# Patient Record
Sex: Male | Born: 1950 | Hispanic: Refuse to answer | Smoking: Never smoker
Health system: Southern US, Community
[De-identification: ages and names within clinical notes are randomized; demographics above are authoritative.]

## PROBLEM LIST (undated history)

## (undated) DIAGNOSIS — I5030 Unspecified diastolic (congestive) heart failure: Secondary | ICD-10-CM

## (undated) DIAGNOSIS — I10 Essential (primary) hypertension: Secondary | ICD-10-CM

## (undated) DIAGNOSIS — C61 Malignant neoplasm of prostate: Secondary | ICD-10-CM

## (undated) DIAGNOSIS — I499 Cardiac arrhythmia, unspecified: Secondary | ICD-10-CM

## (undated) HISTORY — DX: Essential (primary) hypertension: I10

## (undated) HISTORY — DX: Malignant neoplasm of prostate: C61

## (undated) HISTORY — DX: Unspecified diastolic (congestive) heart failure: I50.30

## (undated) HISTORY — DX: Cardiac arrhythmia, unspecified: I49.9

## (undated) HISTORY — PX: OTHER SURGICAL HISTORY: SHX169

---

## 2002-06-09 ENCOUNTER — Emergency Department (HOSPITAL_COMMUNITY): Admission: EM | Admit: 2002-06-09 | Discharge: 2002-06-10 | Payer: Self-pay | Admitting: Emergency Medicine

## 2002-06-10 ENCOUNTER — Inpatient Hospital Stay (HOSPITAL_COMMUNITY): Admission: EM | Admit: 2002-06-10 | Discharge: 2002-06-13 | Payer: Self-pay | Admitting: Emergency Medicine

## 2002-06-11 ENCOUNTER — Encounter: Payer: Self-pay | Admitting: *Deleted

## 2002-06-11 ENCOUNTER — Encounter (INDEPENDENT_AMBULATORY_CARE_PROVIDER_SITE_OTHER): Payer: Self-pay | Admitting: Cardiology

## 2004-07-21 ENCOUNTER — Emergency Department (HOSPITAL_COMMUNITY): Admission: EM | Admit: 2004-07-21 | Discharge: 2004-07-21 | Payer: Self-pay | Admitting: Emergency Medicine

## 2005-07-02 ENCOUNTER — Ambulatory Visit (HOSPITAL_BASED_OUTPATIENT_CLINIC_OR_DEPARTMENT_OTHER): Admission: RE | Admit: 2005-07-02 | Discharge: 2005-07-02 | Payer: Self-pay | Admitting: Urology

## 2007-06-03 ENCOUNTER — Emergency Department (HOSPITAL_COMMUNITY): Admission: EM | Admit: 2007-06-03 | Discharge: 2007-06-03 | Payer: Self-pay | Admitting: Emergency Medicine

## 2007-12-02 ENCOUNTER — Ambulatory Visit: Admission: RE | Admit: 2007-12-02 | Discharge: 2007-12-09 | Payer: Self-pay | Admitting: Radiation Oncology

## 2008-01-30 ENCOUNTER — Ambulatory Visit: Admission: RE | Admit: 2008-01-30 | Discharge: 2008-02-23 | Payer: Self-pay | Admitting: Radiation Oncology

## 2008-03-09 ENCOUNTER — Emergency Department (HOSPITAL_COMMUNITY): Admission: EM | Admit: 2008-03-09 | Discharge: 2008-03-09 | Payer: Self-pay | Admitting: Emergency Medicine

## 2008-05-21 ENCOUNTER — Encounter: Admission: RE | Admit: 2008-05-21 | Discharge: 2008-05-21 | Payer: Self-pay | Admitting: Urology

## 2008-06-14 ENCOUNTER — Ambulatory Visit (HOSPITAL_BASED_OUTPATIENT_CLINIC_OR_DEPARTMENT_OTHER): Admission: RE | Admit: 2008-06-14 | Discharge: 2008-06-14 | Payer: Self-pay | Admitting: Urology

## 2008-07-01 ENCOUNTER — Ambulatory Visit: Admission: RE | Admit: 2008-07-01 | Discharge: 2008-08-17 | Payer: Self-pay | Admitting: Radiation Oncology

## 2008-10-07 ENCOUNTER — Emergency Department (HOSPITAL_COMMUNITY): Admission: EM | Admit: 2008-10-07 | Discharge: 2008-10-07 | Payer: Self-pay | Admitting: Emergency Medicine

## 2009-11-06 ENCOUNTER — Emergency Department (HOSPITAL_COMMUNITY): Admission: EM | Admit: 2009-11-06 | Discharge: 2009-11-06 | Payer: Self-pay | Admitting: Emergency Medicine

## 2010-01-25 IMAGING — CR DG CHEST 2V
3 series · 3 of 3 positions shown · non-contrast
Comparison: None

CLINICAL DATA: Prostate carcinoma, preop

CHEST - 2 VIEW

[view not recorded (1 of 3)]
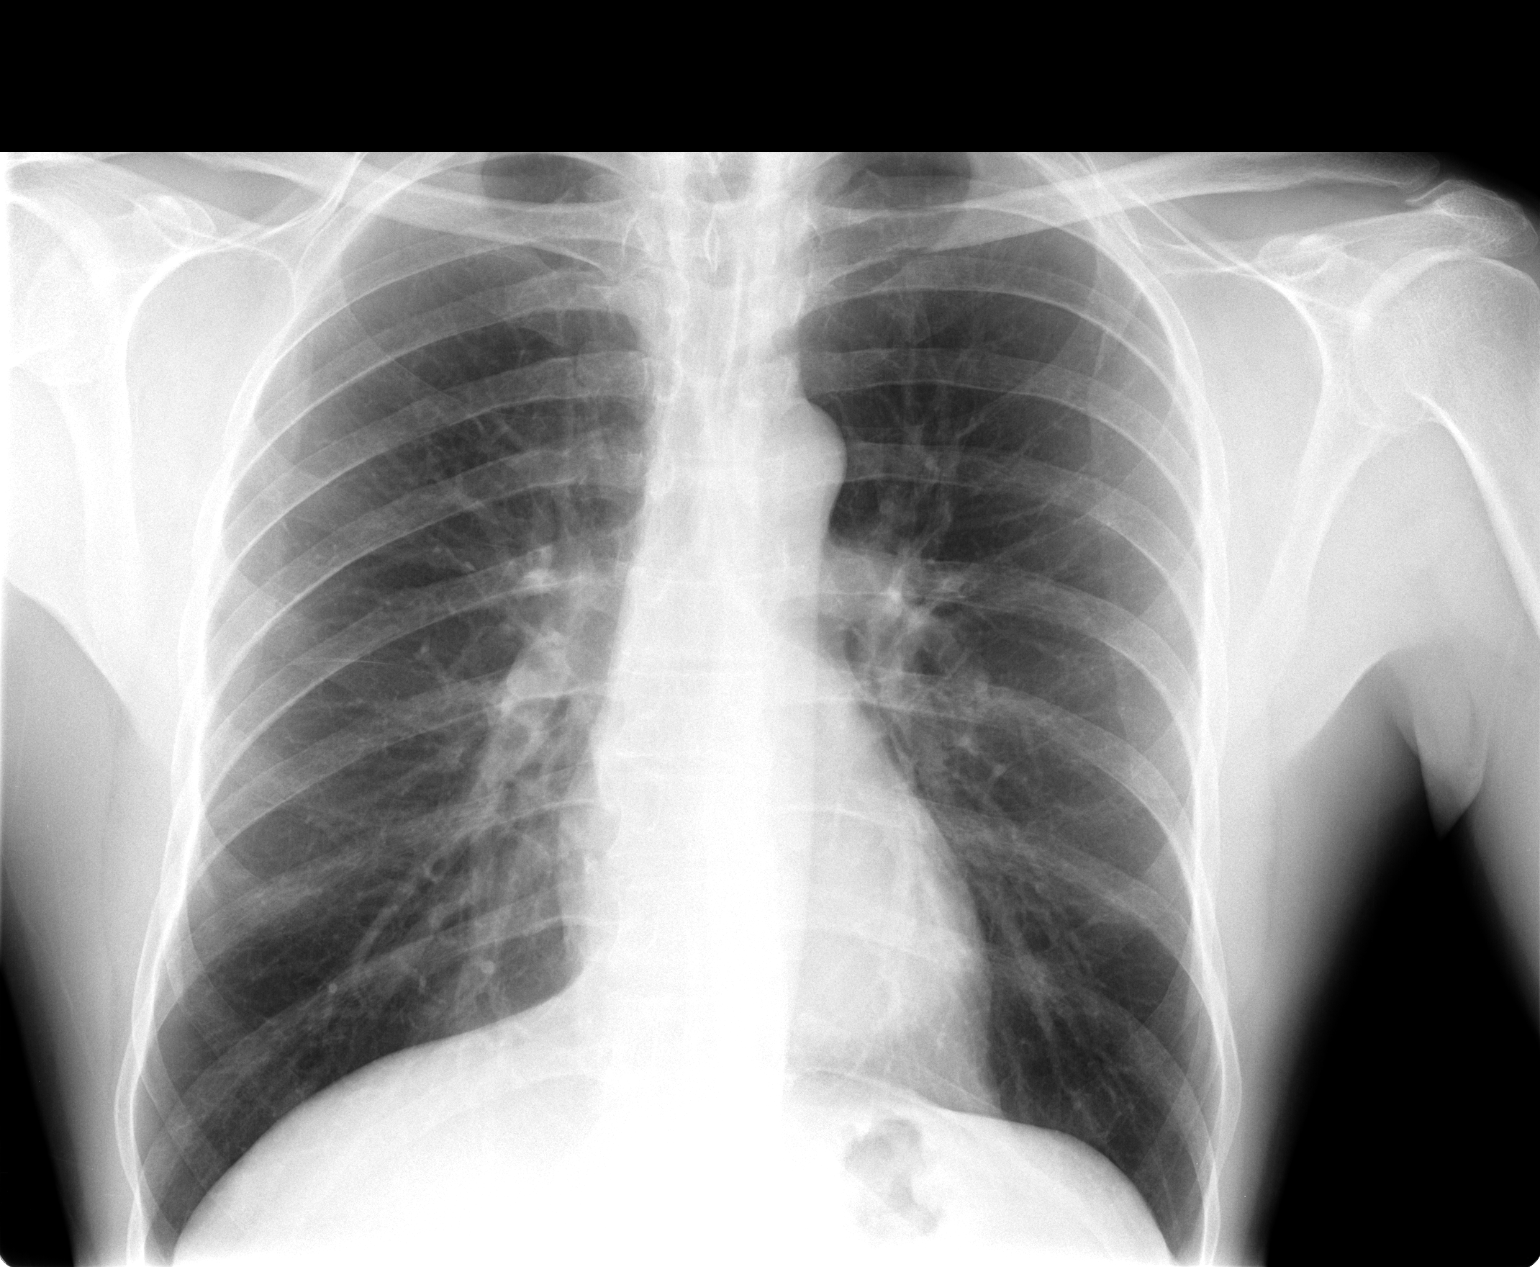

[view not recorded (2 of 3)]
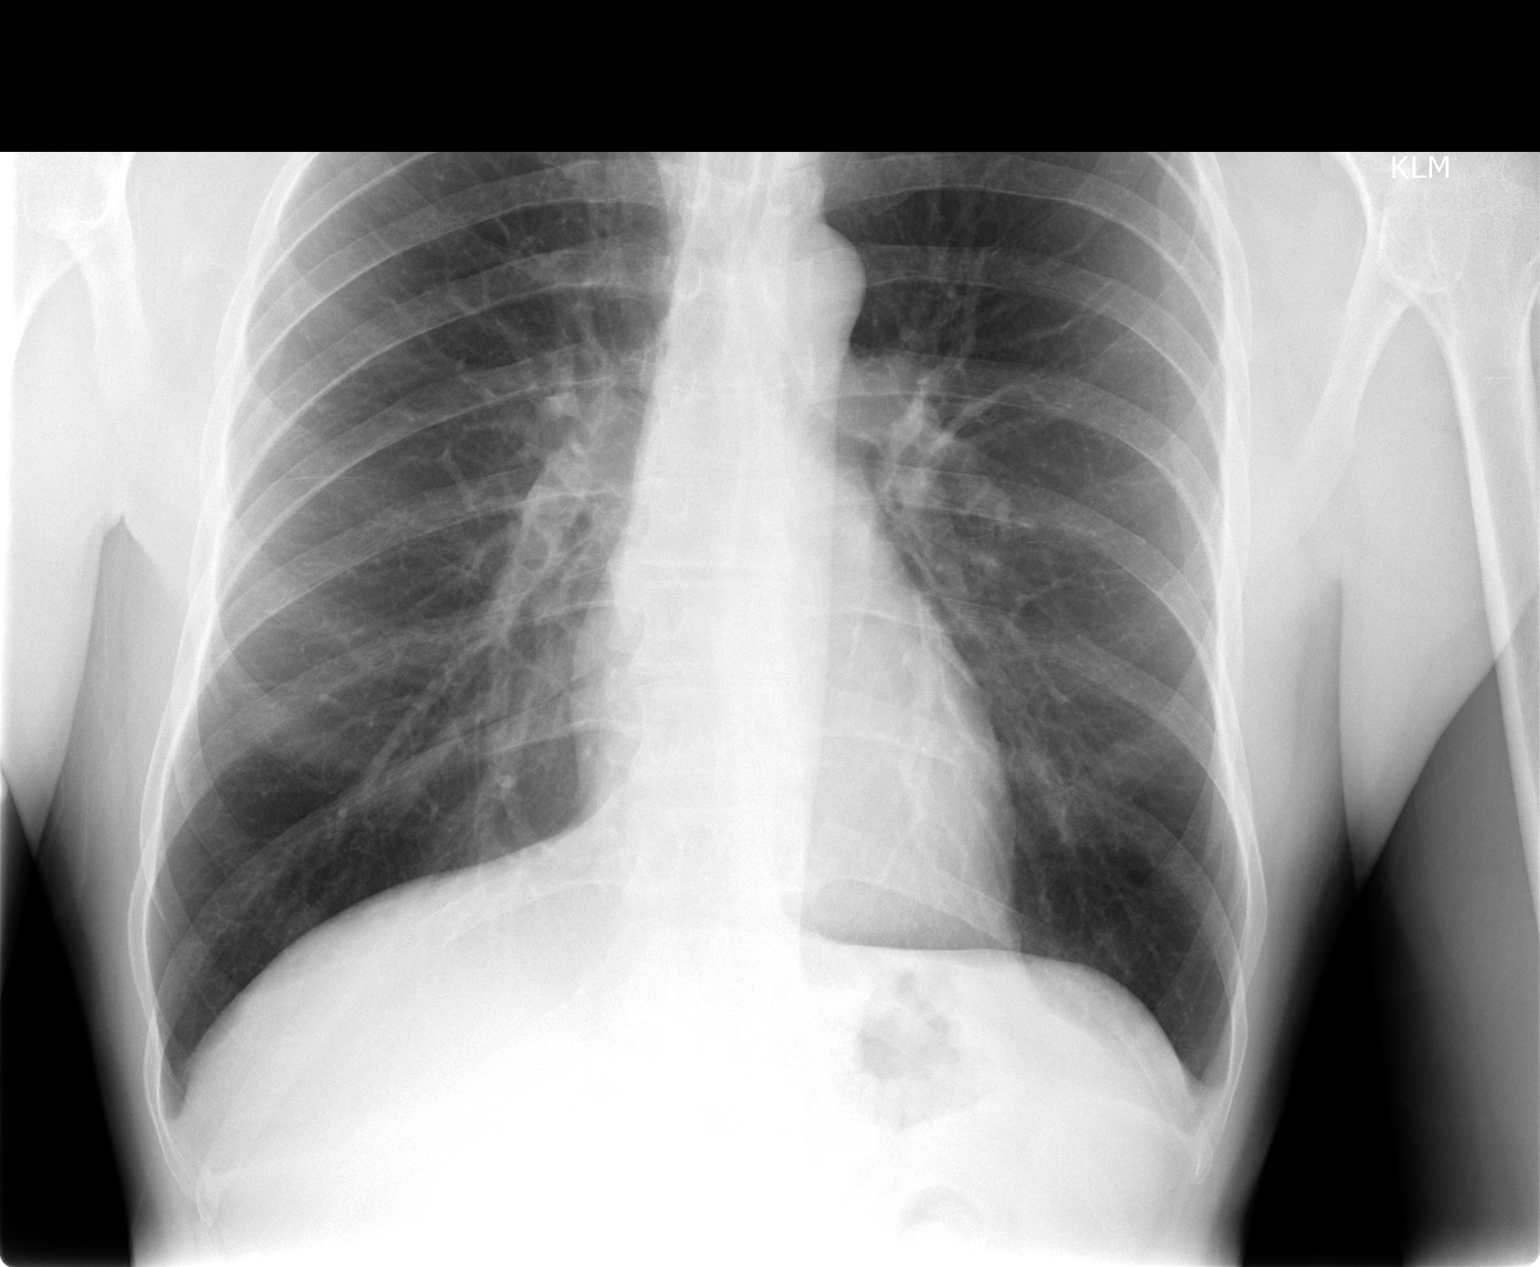

[view not recorded (3 of 3)]
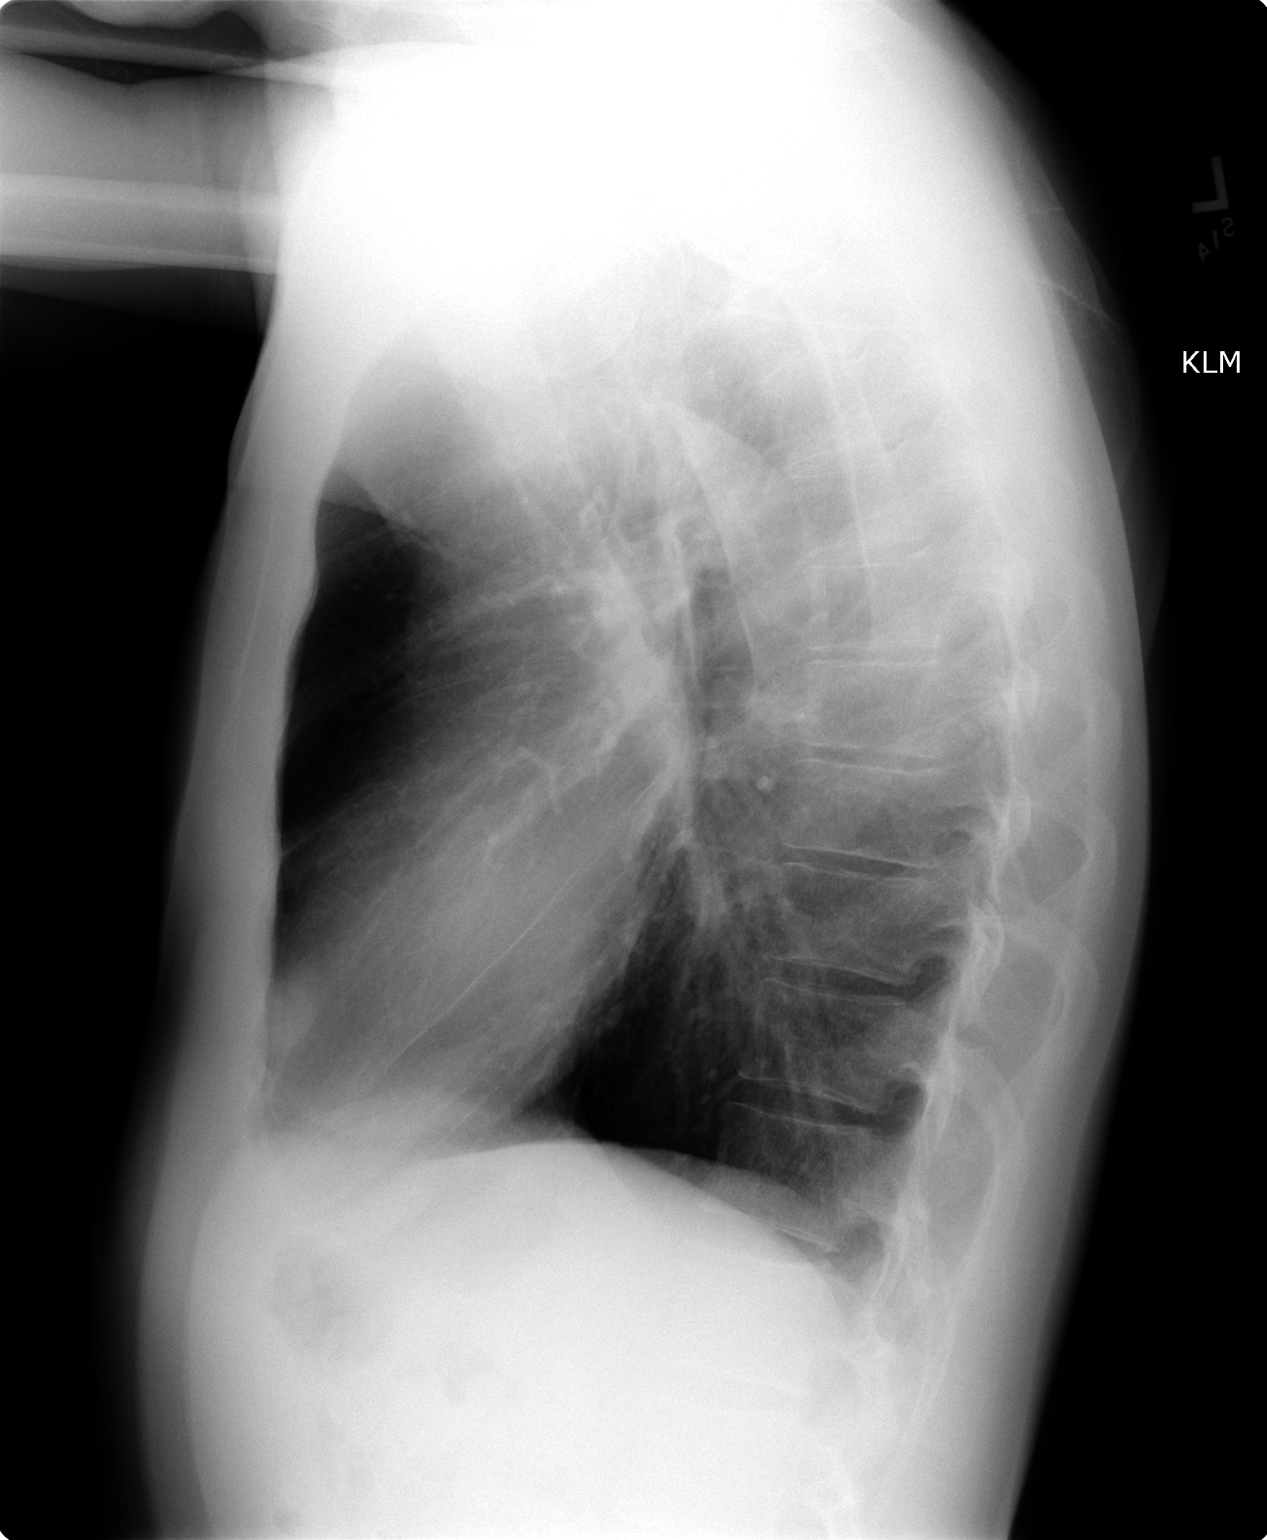

[3 of 3 positions shown; findings below may reference images not displayed]

FINDINGS: The lungs are clear. The heart is within normal limits in
size. No bony abnormality is seen.
IMPRESSION: No active lung disease.

## 2010-07-06 LAB — COMPREHENSIVE METABOLIC PANEL
ALT: 14 U/L (ref 0–53)
AST: 15 U/L (ref 0–37)
Albumin: 3.9 g/dL (ref 3.5–5.2)
Alkaline Phosphatase: 49 U/L (ref 39–117)
BUN: 12 mg/dL (ref 6–23)
CO2: 31 mEq/L (ref 19–32)
Calcium: 9 mg/dL (ref 8.4–10.5)
Chloride: 103 mEq/L (ref 96–112)
Creatinine, Ser: 1.34 mg/dL (ref 0.4–1.5)
GFR calc Af Amer: >60 mL/min (ref >60)
GFR calc non Af Amer: 55 mL/min — ABNORMAL LOW (ref >60)
Glucose, Bld: 108 mg/dL — ABNORMAL HIGH (ref 70–99)
Potassium: 4 mEq/L (ref 3.5–5.1)
Sodium: 138 mEq/L (ref 135–145)
Total Bilirubin: 0.7 mg/dL (ref 0.3–1.2)
Total Protein: 6 g/dL (ref 6.0–8.3)

## 2010-07-06 LAB — CBC
HCT: 38 % — ABNORMAL LOW (ref 39.0–52.0)
Hemoglobin: 13.1 g/dL (ref 13.0–17.0)
MCHC: 34.6 g/dL (ref 30.0–36.0)
MCV: 88.6 fL (ref 78.0–100.0)
Platelets: 176 10*3/uL (ref 150–400)
RBC: 4.29 MIL/uL (ref 4.22–5.81)
RDW: 12.6 % (ref 11.5–15.5)
WBC: 4.8 10*3/uL (ref 4.0–10.5)

## 2010-08-08 NOTE — Op Note (Signed)
NAME:  Gregory Gomez, Gregory Gomez NO.:  [id number]   MEDICAL RECORD NO.:  [id number]          PATIENT TYPE:  AMB   LOCATION:  [facility]                         FACILITY:  [facility]   PHYSICIAN:  Valetta Fuller, M.D.  DATE OF BIRTH:  [birth year]   DATE OF PROCEDURE:  DATE OF DISCHARGE:                               OPERATIVE REPORT   PREOPERATIVE DIAGNOSIS:  Clinical stage T1C adenocarcinoma of the  prostate.   POSTOPERATIVE DIAGNOSIS:  Clinical stage T1C adenocarcinoma of the  prostate.   PROCEDURE PERFORMED:  1. I-125 prostatic seed implantation via Contractor.  2. Flexible cystoscopy.   SURGEON:  Dr. Isabel Caprice   ASSISTANT:  Dr. Dayton Scrape.   ANESTHESIA:  General.   INDICATIONS:  Gregory Gomez was diagnosed with adenocarcinoma of the  prostate in August 2009.  The patient had a small prostate with 3 out of  12 cores positive.  The majority of his tumor was Gleason 6, but there  was one core positive for Gleason 7 tumor.  The patient has had fairly  normal sexual functioning with minimal voiding symptoms.  The patient  was felt to have favorable to intermediate risk clinical stage T1C  adenocarcinoma of the prostate.  The patient underwent extensive  consultation with myself, as well as Dr. Dayton Scrape about treatment options.  We had suggested strong consideration for robotic prostatectomy given  his young age and good health, but felt that given his relatively  favorable disease that he would likely have good outcome with seed  implantation.  The patient initially was on the schedule for the fall  for seed implantation.  He elected to postpone his surgery and cancel  his surgery in December 2009.  The patient subsequently called to  reschedule.  He appears to understand the advantages and disadvantages  of seed implantation.  Full informed consent was obtained.   OPERATIVE TECHNIQUE AND FINDINGS:  The patient was brought to the  operating room.  He had successful  induction of general anesthesia and  was placed in lithotomy position.  A Foley catheter was placed sterilely  on the field.  The patient had a transrectal ultrasound probe placed  with anchoring needles in the prostate and the ultrasound was anchored  to a stand.  Dr. Dayton Scrape and the radiation oncology team then did real-  time contouring of the prostate, urethra and rectum.  Real-time dose was  established.  We then came in for the actual needle placement.  A total  of 27 needles were placed.  Each needle was placed with real-time  sagittal ultrasound guidance.  The Nucletron drawn robotic implanter was  then used for implantation of the seeds.  A total number of 82 active  seeds  were implanted.  Reference dose was 145 gray.  At the completion of the  procedure fluoroscopic imaging suggested good distribution of the seeds.  Flexible cystoscopy revealed no evidence of seeds within the prostatic  urethra or bladder and a new Foley catheter was placed.  The patient was  brought to the recovery room in stable condition.      Gregory Hua  Ellin Gomez, M.D.  Electronically Signed     DSG/MEDQ  D:  06/14/2008  T:  06/14/2008  Job:  [id number]

## 2010-08-11 NOTE — H&P (Signed)
NAME:  Gregory Gomez, Gregory Gomez                        ACCOUNT NO.:  [id number]   MEDICAL RECORD NO.:  [id number]                   PATIENT TYPE:  INP   LOCATION:  3730                                 FACILITY:  [facility]   PHYSICIAN:  Thora Lance, M.D.               DATE OF BIRTH:  1950-08-30   DATE OF ADMISSION:  06/10/2002  DATE OF DISCHARGE:                                HISTORY & PHYSICAL   CHIEF COMPLAINT:  Palpitations.   HISTORY OF PRESENT ILLNESS:  The patient is a 60 year old, black, male  patient of Thora Lance, M.D., who for the past 24 hours has had episodes  of palpitations on and off.  When they occur, he gets dyspneic and if he is  walking when they occur, he has to stop and get his breath.  He presented to  the walk-in clinic yesterday and was found to have a heart rate of 170.  He  was then sent to [facility].  After adenosine IV in the  hospital, his heart rate decreased and he went back into sinus rhythm with a  rate of 80.  He appeared to be in an SVT according to the ER admission EKG.  While at home last evening, he continued to have episodes of palpitations  with dyspnea.  He has never had this before.  He  has had no chest pain or  pressure.   In the office, he was found to be in SVT with a heart rate of 170.  He is  being admitted for the same.   REVIEW OF SYSTEMS:  No fever.  No recent illness.  CHEST:  No chest pressure  or pain.  LUNGS:  Dyspnea as above.  No wheezing.   PAST MEDICAL HISTORY:  Constipation.   PAST SURGICAL HISTORY:  Tonsillectomy.   SOCIAL HISTORY:  No ETOH use.  No caffeine.  Denies any illicit drug use.   FAMILY HISTORY:  His father died at age 62 from unknown cause.  A brother  died from an MI at age 51.  Another brother had a heart attack in his 57s.   OBJECTIVE DATA:  VITAL SIGNS:  Temperature 96.8 degrees, pulse 74 initially,  respirations 22.  WEIGHT:  164 pounds.  GENERAL APPEARANCE:  He is alert and  oriented.  He appeared very pleasant.  HEENT:  Eyes:  PERRLA.  EOMs intact.  Ears, nose, and throat unremarkable.  NECK:  No JVD.  The thyroid gland is not enlarged.  No masses or  irregularities palpated.  HEART:  Rapid S1 and S2.  I do not hear a murmur, but he is tachycardic at  160.  LUNGS:  Breath sounds are clear.  ABDOMEN:  Nontender.  EXTREMITIES:  No edema.  NEUROLOGIC:  Nonfocal.   LABORATORY DATA:  The EKG shows PSVT with a heart rate of 170.   IMPRESSION:  Paroxysmal supraventricular  tachycardia.    PLAN:  1. Admit.  2. IV Cardizem.  3. Cardiology follow-up.     Tamera C. Lianne Cure                     Thora Lance, M.D.    TCL/MEDQ  D:  06/10/2002  T:  06/10/2002  Job:  [id number]

## 2010-08-11 NOTE — Consult Note (Signed)
NAME:  [name], Gomez                        ACCOUNT NO.:  [id number]   MEDICAL RECORD NO.:  [id number]                   PATIENT TYPE:  INP   LOCATION:  3730                                 FACILITY:  [facility]   PHYSICIAN:  Lesleigh Noe, M.D.            DATE OF BIRTH:  02/28/1951   DATE OF CONSULTATION:  06/10/2002  DATE OF DISCHARGE:                                   CONSULTATION   REQUESTING PHYSICIAN:  Thora Lance, M.D.   REASON FOR EVALUATION:  Tachyarrhythmia.   CONCLUSIONS:  1. 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  2. Marked ST segment depression during arrhythmia, probably rate related.     Rule out coronary artery disease.   RECOMMENDATIONS:  1. IV Cardizem bolus followed by infusion to break arrhythmia.  2. IV Heparin.  3. A 2-D Doppler echocardiogram.  4. Stress Cardiolite to rule out CAD.  5. Will probably start oral beta blocker or oral Cardizem for arrhythmia     control and follow clinically.  If the arrhythmia is not     managed/controlled by oral medications may need to consider doing EP     ablative therapy.   COMMENTS/HISTORY OF PRESENT ILLNESS:  The patient is a 60 year old,  previously healthy, African-American male with no prior cardiac history.  Over the past 24 hours he has had 2 prolonged episodes of tachypalpitations  that cause weakness and sudden dyspnea.  He was seen in the [facility] Emergency Room last evening after presenting to the [facility].  He was given IV Adenocard, the arrhythmia broke and he was  discharged home.  This morning, the arrhythmia recurred.   He came in to Dr. Jone Baseman office and was noted, again, to be in SVT at a  rate of approximately 170.  He was sent to the emergency room and we gave  instructions to give IV bolus of Cardizem followed by a continuous drip and  this reverted the arrhythmia.  He currently feels well and is having  no  discomfort, shortness of breath, or other complaint.   MEDICATIONS:  Metamucil.   ALLERGIES:  None.   PAST MEDICAL HISTORY:  Significant past medical problems:  None.   SOCIAL HISTORY:  Habits:  Does not smoke or drink.   FAMILY HISTORY:  Father died in his late 85s, etiology uncertain.  Mother is  alive and in good health.  Two brothers have had myocardial infarction.  One  brother died at age 40. Another brother had a heart attack at age 71, but is  still living.   REVIEW OF SYSTEMS:  Intermittent sinus congestion.  Occasional feeling of  abdominal bloating.  This is relieved by bowel movements.  Occasional lumps  on neck that resolve.  No prior history of palpitations.  Never had syncope.  No chest discomfort.  No nocturia or other complaints.  PHYSICAL EXAMINATION:  GENERAL:  On exam the patient is in no acute  distress.  VITAL SIGNS:  He is a well-developed, well-nourished, African-American male  with a blood pressure of 118/70, heart rate 88, respirations 16 and  unlabored.  SKIN:  Warm and dry. No cyanosis was noted.  HEENT:  Reveals pupils that are equally reactive to light.  Extraocular  movements are full.  NECK:  Neck exam reveals no JVD, carotid bruits, or thyromegaly.  CHEST:  Chest was clear to auscultation and percussion.  CARDIOVASCULAR:  Cardiac exam reveals no click, no rub, no gallop, no  murmur.  ABDOMEN:  Soft.  Liver edge was not palpable.  Bowel sounds are normal.  EXTREMITIES:  Reveal no edema.  Pulses are 2+ and symmetric in upper and  lower extremities.  NEUROLOGIC:  Exam is normal.   LABORATORY DATA:  Hemoglobin is 15.6, WBC 7.1.  Potassium 3.7, BUN 11,  creatinine 1.2.  CK/MB and troponin I are negative x2.  EKG reveals  nonspecific ST-T abnormality.  EKG seems to demonstrate sinus rhythm with  first degree AV block postconversion.   ASSESSMENT:  1. Paroxysmal supraventricular tachycardia, recurrent.  Suspect this     represents AV nodal  reentry tachycardia.  Rule out other.  2. Marked ST abnormality during arrhythmia and nonspecific ST abnormality     post conversion.  Rule out coronary disease.  I suspect that these ST-T     changes are rate related and not actual ischemic changes due to     obstructive coronary disease.                                               Lesleigh Noe, M.D.    HWS/MEDQ  D:  06/10/2002  T:  06/11/2002  Job:  [id number]   cc:   Thora Lance, M.D.  [address]  [place]  [place] [address]  Fax: [phone]

## 2010-08-11 NOTE — Consult Note (Signed)
NAME:  Gregory Gomez, Silversmith                        ACCOUNT NO.:  [id number]   MEDICAL RECORD NO.:  [id number]                   PATIENT TYPE:  INP   LOCATION:  3730                                 FACILITY:  [facility]   PHYSICIAN:  Doylene Canning. Ladona Ridgel, M.D. [facility]           DATE OF BIRTH:  Jun 21, 1950   DATE OF CONSULTATION:  06/12/2002  DATE OF DISCHARGE:  06/13/2002                                   CONSULTATION   REASON FOR CONSULTATION:  Evaluation of supraventricular tachycardia.   HISTORY OF PRESENT ILLNESS:  The patient is a very pleasant 60 year old man  who has been fairly healthy.  He has a past medical history of constipation  but no hypertension or other significant symptoms.  Over the last several  weeks, he has had increasing palpitations and subsequently had documented  SVT at rates of up to 170 beats per minute. This has been treated with IV  adenosine which apparently did not terminate his tachycardia.  Calcium  channel blockers have slowed his heart rate.   Because of marked EKG abnormality, he underwent exercise Cardiolite stress  testing demonstrating by report no ischemia.  He has been subsequently  hospitalized and treated with beta blockers (Betapace), and his heart rate  has improved.   Careful review of his rhythm strips demonstrates a narrow QRS tachycardia at  up to 170 beats per minute.  At other times, he has very low-amplitude  atrial arrhythmias with what appears to be an underlying atrial  tachycardia/flutter at a cycle length of around 330 to 340 msec associated  with variable A-V block, typically 2:1 A-V block.   The patient denies any symptoms consistent with thyroid problems.  He denies  any other significant complaints today.  Otherwise, his health has been  quite good.   FAMILY HISTORY:  Notable for a mother who has a history of arrhythmias.  He  has a family history of coronary disease as well.   SOCIAL HISTORY:  The patient is married and lives in  [place].  He  denies tobacco or ethanol use.   REVIEW OF SYSTEMS:  Please refer to [name]'s Review of Systems.  In  general, he has palpitations and weakness associated with his tachycardia  but otherwise Review of Systems is negative.   PHYSICAL EXAMINATION:  GENERAL:  Pleasant, well-appearing, middle-aged man  in no distress.  VITAL SIGNS:  Blood pressure 108/70, pulse 90 and regular, respirations 18,  temperature 99.  HEENT:  Normocephalic and atraumatic.  Pupils equal and round.  Oropharynx  moist.  Sclerae anicteric.  NECK:  No jugular venous distention.  There was no thyromegaly.  Carotids  were 2+ and symmetric.  CARDIOVASCULAR:  Regular rate and rhythm with normal S1 and S2.  I did not  appreciate any murmurs, rubs, or gallops.  LUNGS:  Clear bilaterally to auscultation.  ABDOMEN:  Soft, nontender, nondistended.  There was no obvious organomegaly.  EXTREMITIES:  Demonstrated no cyanosis, clubbing, or edema.   LABORATORY DATA:  EKG demonstrates very low-amplitude atrial signals but  what appears to be 2:1 atrial tachycardia with 1 P wave and in a T wave and  one P wave buried in the QRS.   Laboratory data of importance incudes a TSH of 2.342.   IMPRESSION:  1. Probable atypical atrial flutter with variable arteriovenous conduction     versus atrial tachycardia (incessant) with variable arteriovenous     conduction.  2. Palpitations secondary to #1.  3. Abnormal electrocardiogram with negative Cardiolite.   RECOMMENDATIONS:  At the present time, Betapace is being used for this  patient.  I think a plain beta blocker like Toprol or Tenormin would also be  an option and might not carry the risk of proarrhythmia in this patient.  For now, I think rate control would be reasonable.  I agree with Coumadin as  patient's particularly who have left atrial tachycardia certainly have the  same increased risk of thromboembolism as do patients who have atrial  fibrillation  or atrial flutter.   I will plan to see the patient as an outpatient to talk about additional  treatments including ablation therapy for this patient, although I think the  finding of what appears to be an atypical atrial flutter makes the mechanism  of his flutter more likely to be in the left atrium rather than the right  atrium.                                               Doylene Canning. Ladona Ridgel, M.D. [facility]    GWT/MEDQ  D:  06/12/2002  T:  06/14/2002  Job:  [id number]   cc:   Thora Lance, M.D.  [address]  [place]  [place] [address]  Fax: [phone]   Armstead Peaks, M.D.  [address] [place], [place] [address]  Fax: [phone]   Lyn Records III, M.D.  [address]  [place]  [place] [address]  Fax: [phone]   Kathrine Cords, R.N. [facility]

## 2010-08-11 NOTE — Op Note (Signed)
NAME:  Gregory Gomez, Gregory Gomez NO.:  [id number]   MEDICAL RECORD NO.:  [id number]          PATIENT TYPE:  AMB   LOCATION:  [facility]                         [facility]ACILITY:  [facility]   PHYSICIAN:  Valetta Fuller, M.D.  DATE OF BIRTH:  October 24, 1950   DATE OF PROCEDURE:  07/02/2005  DATE OF DISCHARGE:                                 OPERATIVE REPORT   PREOPERATIVE DIAGNOSIS:  Extensive penile condyloma.   POSTOPERATIVE DIAGNOSIS:  Extensive penile condyloma.   PROCEDURE:  SLT YAG contact laser treatment of penile condyloma.   SURGEON:  Valetta Fuller, M.D.   ANESTHESIA:  MAC with penile block.   INDICATIONS FOR PROCEDURE:  [name] is a 60 year old male who has had  penile condyloma.  Last time we evaluated him in February, he had multiple  confluent areas of condyloma primarily in the dorsum of base of his penis,  but also a couple of confluent areas on the ventral surface of the penis.  Several of these areas were in excess of 2 cm x 2 cm in area.  The patient  has failed several courses of topical therapy.  We felt that these areas  were much too large to treat with cryo procedure.  We recommended SLT YAG  contact laser.  The urethra showed no abnormalities.  Patient appeared to  understand the advantages and disadvantages of this approach.  Full informed  consent was obtained.   TECHNIQUE AND FINDINGS:  Patient was brought to the operating room where he  had intravenous sedation.  A penile block was then performed with a  combination of lidocaine and Marcaine.  Patient was prepped and draped in  the usual manner utilizing moist towels.  Acetic acid washing was also  utilized.  Three large confluent areas of condyloma were treated with SLT  YAG contact laser at 15 watts with a small round tip.  This included large  area on the dorsum of his penis near the penile base and two confluent areas  on the ventral mid aspect of the penis.  I injected some lidocaine jelly  within the  urethra and carefully examined the inner part of the urethra and  saw no evidence of urethral condyloma.  Patient appeared to tolerated the  procedure well.  There were no obvious complications or difficulties.  He  was brought to the recovery room in stable condition.           ______________________________  Valetta Fuller, M.D.  Electronically Signed     DSG/MEDQ  D:  07/03/2005  T:  07/03/2005  Job:  [id number]

## 2010-08-11 NOTE — Discharge Summary (Signed)
NAME:  Gregory Gomez, Gregory Gomez                        ACCOUNT NO.:  [id number]   MEDICAL RECORD NO.:  [id number]                   PATIENT TYPE:  INP   LOCATION:  3730                                 FACILITY:  [facility]   PHYSICIAN:  Armanda Magic, M.D.                  DATE OF BIRTH:  [birth year]   DATE OF ADMISSION:  06/10/2002  DATE OF DISCHARGE:  06/13/2002                                 DISCHARGE SUMMARY   PRIMARY CARE CARDIOLOGIST:  Dr. Verdis Prime.   PRIMARY CARE PHYSICIAN:  Dr. Kirby Funk.   CONSULTANTS:  Dr. Lewayne Bunting, [facility].   PROCEDURES:  1. (June 11, 2002) adenosine Cardiolite, negative for ischemia.  2. A 2-D echocardiogram, revealing normal LV size and function, EF greater     than 50%, mildly dilated left atrium, 41 mm; he has no significant     cardiac valvular abnormalities.   DISCHARGE DIAGNOSES:  1. Symptomatic palpitations related to supraventricular tachycardia,     probable atypical atrial flutter with variable atrioventricular     conduction versus atrial tachycardia (incessant) with variable     atrioventricular conduction.     A. Dr. Lewayne Bunting of electrophysiology consulted, felt beta blocker,        such as Toprol or Tenormin, would be option for rate control and not        carry the risk of proarrhythmia, agreed with Coumadin for decreased        thromboembolic event.  Will see as an outpatient to discuss additional        treatments, including ablation, though acknowledged what appeared to        be an atypical atrial flutter, making mechanism of his flutter to be        in the left atrium rather than the right atrium.     B. A 2-D echocardiogram with normal ejection fraction without significant        cardiac valvular abnormalities, mild left atrial enlargement.  Normal        ejection fraction.     C. Adenosine Cardiolite negative for ischemia or scar.  Ejection fraction        48%.     D. Initiated on Betapace this admission,  QTc 396 milliseconds at time of        discharge.  2. Systemic anticoagulation secondary to atrial fibrillation/flutter,     initiated on Coumadin this admission.  His baseline coagulations were     within normal range.  At time of discharge, his prothrombin time was     14.4, International Normalized Ratio of 1.1.  He will follow up in our     Coumadin Clinic on Monday, June 15, 2002.   PLAN:  Patient discharged home in stable condition.   DISCHARGE MEDICATIONS:  1. (New) Coumadin 5 mg one p.o. q.h.s.  2. (New) Betapace AF 120 mg p.o. q.12h.  ACTIVITY:  As tolerated.   DIET:  Low-fat, low-cholesterol, no-added-salt.   FOLLOW UP:  1. Dr. Verdis Prime, Friday, June 26, 2002, at 11 o'clock.  2. Coumadin Clinic, Monday, June 15, 2002, at 8:15 a.m.  3. Dr. Lewayne Bunting; his office will call patient with that followup ______     visit.   HISTORY OF PRESENT ILLNESS:  Gregory Gomez is a pleasant 60 year old  gentleman with a history of episodic palpitations.  Over the past several  weeks, he had been having increasing palpitations and subsequently had  documented SVT at rate up to 170 beats per minute.  This had been treated  with IV adenosine on a prior ER visit (prior to this admission), which  apparently did not terminate his tachycardia but which did spontaneously  convert to NSR.   On the morning of admission, patient had recurrent DOE and senses of  tachypalpitations with episodic diaphoresis with activity.  Presented to his  primary care's office where EKG revealed supraventricular tachycardia with  heart rate 170.  He was treated with IV adenosine without conversion.  Rate  control achieved with calcium-channel blockers.  He was started on Betapace.  EPS consult obtained by  Dr. Lewayne Bunting with details as above.  He underwent adenosine Cardiolite,  which was negative for ischemia or scar, and had an essentially normal 2-D  echocardiogram.  He was discharged home.   Further details as above.   LABORATORY TESTS/DATA:  WBC 7.1, hemoglobin 15.6, hematocrit of 46,  platelets of 242.  Admission coags within normal range.  Sodium 140, K 3.7,  chloride 105, CO2 of 27, glucose 118, BUN of 11, creatinine 1.2, calcium  9.4.  LFTs all within normal range, though total bili slightly elevated at  1.6.  CK-MB and troponin-I were totally normal times two sets.  TSH within  normal range at 2.342.     Salomon Fick, N.P.                       Armanda Magic, M.D.    MES/MEDQ  D:  08/27/2002  T:  08/27/2002  Job:  [id number]   cc:   Doylene Canning. Ladona Ridgel, M.D.   Thora Lance, M.D.  [address]  [place]  [place] [address]  Fax: [phone]

## 2015-12-05 ENCOUNTER — Ambulatory Visit: Payer: Self-pay | Admitting: Neurology

## 2015-12-14 ENCOUNTER — Ambulatory Visit: Payer: Self-pay | Admitting: Neurology
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
## 2016-01-13 ENCOUNTER — Encounter: Payer: Self-pay | Admitting: Neurology

## 2016-01-13 ENCOUNTER — Ambulatory Visit (INDEPENDENT_AMBULATORY_CARE_PROVIDER_SITE_OTHER): Payer: BLUE CROSS/BLUE SHIELD | Admitting: Neurology

## 2016-01-13 VITALS — BP 140/76 | HR 70 | Ht 72.0 in | Wt 155.0 lb

## 2016-01-13 DIAGNOSIS — G308 Other Alzheimer's disease: Secondary | ICD-10-CM

## 2016-01-13 DIAGNOSIS — R292 Abnormal reflex: Secondary | ICD-10-CM | POA: Diagnosis not present

## 2016-01-13 DIAGNOSIS — F0280 Dementia in other diseases classified elsewhere without behavioral disturbance: Secondary | ICD-10-CM | POA: Diagnosis not present

## 2016-01-13 MED ORDER — ESCITALOPRAM OXALATE 10 MG PO TABS: 10.0000 mg | ORAL_TABLET | Freq: Every day | ORAL | 2 refills | Status: DC

## 2016-01-13 NOTE — Progress Notes (Signed)
Clinical Social Work Note  CSW received request to meet with pt and pt family during office visit today. Pt is a 65 y.o. male diagnosed with dementia, likely dementia of the Alzheimer's type. CSW met pt in exam room. Pt wife, daughter, and nephew present at this time. CSW introduced self and explained role. Pt reports that he lives alone in a home in [place]. Pt wife does not live with pt. Pt had two daughters, one present at today's appointment lives in [place] and another daughter is in college in [place]. Pt family reports that pt has two brothers who check on him and one brother checks on him daily.  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 CSW expressed concerns about pt living alone. Pt wife expressed awareness that assisted living would have to be looked into in the future. CSW provided education around assisted living and discussed that assisted living facilities only accept Medicaid and private pay. CSW discussed that it will be beneficial to look into if pt qualifies for Medicaid and they are agreeable to resources to explore this, but feel that he likely does not qualify at this time due to assets. CSW discussed other resources including home health and private duty care agencies that can assist with personal care, etc in the home. CSW explained that home health companies bill insurance and private duty care agencies are private pay. Pt family did not feel home health services would be beneficial at this time. Pt wife expressed that pt needs assistance with cleaning and organizing his home, reporting that there is some hoarding going on in the home. CSW recommended pt family contact the [employer] ([employer]) for recommendations on cleaning services. CSW discussed additional resources including [employer] [place] case assistance where a case Freight forwarder can come to the home and assist in connecting to  appropriate resources and discussed case management available through Alzheimer's Association. Pt family expressed that they will likely start with the case assistance through [employer]. Pt family shared that one of pt brother is a Company secretary and pt attends the church that pt brother is a Company secretary and CSW discussed that church communities can often be a good resource to get feedback about services and companies individuals have used. CSW discussed nutritional resources as pt family reports that pt mainly eats fast food that his brother brings to the home or that the patient goes to get on his own. CSW discussed with pt family that they have the right to contact Adult Protective Services (APS) if the pt is resistance to assistance that they are putting into place to assist pt. Per Dr. Carles Collet note, Dr. Carles Collet discussed with the family that if pt family is unable to arrange additional care for pt on their own then adult protective services will be contacted.   Pt and pt family declined home health services and declined list of private duty care agencies.   CSW provided a list of multiple community resources including, but not limited to, information about how to learn more about Medicaid, [employer] [place] case assistance, information for Alzheimer's association, Elder Horticulturist, commercial, and contact for Adult [employer]. CSW contact information if any questions or concerns arise prior to next office visit.  CSW will plan to meet with pt and pt family during next office visit for ongoing assessment of needs.  Alison Murray, MSW, LCSW Clinical Social  Worker [facility] [phone]

## 2016-01-13 NOTE — Patient Instructions (Signed)
1. We have sent a referral to [facility] for your MRI and they will call you directly to schedule your appt. They are located at [place]. If you need to contact them directly please call [phone].   2. Start Lexapro 10 mg - 1 tablet daily.

## 2016-01-27 ENCOUNTER — Ambulatory Visit: Payer: Self-pay | Admitting: Neurology

## 2016-01-31 ENCOUNTER — Encounter: Payer: Self-pay | Admitting: Neurology

## 2016-02-14 ENCOUNTER — Telehealth: Payer: Self-pay | Admitting: Clinical

## 2016-02-14 NOTE — Telephone Encounter (Addendum)
Clinical Social Work Telephone Note  CSW received telephone call from pt wife stating that pt family was exploring options for assistance and spoke to the [employer] which has a Building control surveyor. Pt wife reports program requires referral from doctor or social worker and requested CSW to complete referral. CSW to complete Disability Assistance Referral form and fax to [employer]. CSW to continue to remain available to provide assistance as needed.  Alison Murray, MSW, LCSW Clinical Social [facility] Neurology [phone]

## 2016-02-15 ENCOUNTER — Telehealth: Payer: Self-pay | Admitting: Clinical

## 2016-02-15 NOTE — Telephone Encounter (Signed)
Clinical Social Work Telephone Note  CSW followed up with pt wife to clarify some questions on referral for Disability Assistance Referral for [employer]. Pt wife reports that she spoke with the [facility] again and found out that they would not be able to provide assistance due pt age as pt is about to be 4 in January. CSW expressed understanding. CSW provided positive reinforcement to pt wife as pt wife discussed that pt family is taking steps to get assistance for patient. CSW encouraged pt wife to contact CSW with any further social work assistance that may be needed prior to pt next office visit. Pt wife agreeable. CSW to continue to remain available to provide support and connection to resources as needed.  Gregory Gomez, MSW, LCSW Clinical Social Banker Neurology [phone]

## 2016-02-23 ENCOUNTER — Other Ambulatory Visit: Payer: Self-pay | Admitting: Neurology

## 2016-02-23 MED ORDER — ESCITALOPRAM OXALATE 10 MG PO TABS: 10.0000 mg | ORAL_TABLET | Freq: Every day | ORAL | 0 refills | Status: DC

## 2016-03-21 ENCOUNTER — Other Ambulatory Visit: Payer: Self-pay | Admitting: Neurology

## 2016-03-21 MED ORDER — ESCITALOPRAM OXALATE 10 MG PO TABS: 10.0000 mg | ORAL_TABLET | Freq: Every day | ORAL | 1 refills | Status: DC

## 2016-04-17 ENCOUNTER — Ambulatory Visit: Payer: BLUE CROSS/BLUE SHIELD | Admitting: Neurology

## 2016-04-19 ENCOUNTER — Ambulatory Visit: Payer: BLUE CROSS/BLUE SHIELD | Admitting: Neurology

## 2016-07-22 ENCOUNTER — Other Ambulatory Visit: Payer: Self-pay | Admitting: Neurology

## 2021-07-24 ENCOUNTER — Encounter: Payer: Self-pay | Admitting: Neurology

## 2022-06-13 ENCOUNTER — Ambulatory Visit (HOSPITAL_BASED_OUTPATIENT_CLINIC_OR_DEPARTMENT_OTHER)
Admission: RE | Admit: 2022-06-13 | Discharge: 2022-06-13 | Disposition: A | Discharge status: Home / Self Care | Payer: Medicare HMO | Source: Ambulatory Visit | Attending: Family Medicine | Admitting: Family Medicine

## 2022-06-13 ENCOUNTER — Other Ambulatory Visit (HOSPITAL_COMMUNITY): Payer: Self-pay | Admitting: Family Medicine

## 2022-06-13 DIAGNOSIS — D649 Anemia, unspecified: Secondary | ICD-10-CM | POA: Diagnosis not present

## 2022-06-13 DIAGNOSIS — R600 Localized edema: Secondary | ICD-10-CM | POA: Diagnosis not present

## 2022-06-13 DIAGNOSIS — R140 Abdominal distension (gaseous): Secondary | ICD-10-CM | POA: Diagnosis not present

## 2022-06-13 DIAGNOSIS — R0602 Shortness of breath: Secondary | ICD-10-CM | POA: Diagnosis not present

## 2022-06-13 DIAGNOSIS — M7989 Other specified soft tissue disorders: Secondary | ICD-10-CM | POA: Diagnosis not present

## 2022-06-13 DIAGNOSIS — Z8679 Personal history of other diseases of the circulatory system: Secondary | ICD-10-CM | POA: Diagnosis not present

## 2022-06-13 DIAGNOSIS — F0390 Unspecified dementia without behavioral disturbance: Secondary | ICD-10-CM | POA: Diagnosis not present

## 2022-07-26 DIAGNOSIS — N1832 Chronic kidney disease, stage 3b: Secondary | ICD-10-CM | POA: Diagnosis not present

## 2022-07-26 DIAGNOSIS — F0280 Dementia in other diseases classified elsewhere without behavioral disturbance: Secondary | ICD-10-CM | POA: Diagnosis not present

## 2022-07-26 DIAGNOSIS — Z136 Encounter for screening for cardiovascular disorders: Secondary | ICD-10-CM | POA: Diagnosis not present

## 2022-07-26 DIAGNOSIS — R7301 Impaired fasting glucose: Secondary | ICD-10-CM | POA: Diagnosis not present

## 2022-07-26 DIAGNOSIS — Z0000 Encounter for general adult medical examination without abnormal findings: Secondary | ICD-10-CM | POA: Diagnosis not present

## 2022-07-26 DIAGNOSIS — Z125 Encounter for screening for malignant neoplasm of prostate: Secondary | ICD-10-CM | POA: Diagnosis not present

## 2022-07-26 DIAGNOSIS — Z23 Encounter for immunization: Secondary | ICD-10-CM | POA: Diagnosis not present

## 2022-07-26 DIAGNOSIS — G309 Alzheimer's disease, unspecified: Secondary | ICD-10-CM | POA: Diagnosis not present

## 2022-07-26 DIAGNOSIS — D519 Vitamin B12 deficiency anemia, unspecified: Secondary | ICD-10-CM | POA: Diagnosis not present

## 2022-07-26 DIAGNOSIS — Z1159 Encounter for screening for other viral diseases: Secondary | ICD-10-CM | POA: Diagnosis not present

## 2022-07-27 ENCOUNTER — Ambulatory Visit: Payer: Medicare HMO | Admitting: Cardiology

## 2022-09-10 DIAGNOSIS — K625 Hemorrhage of anus and rectum: Secondary | ICD-10-CM | POA: Diagnosis not present

## 2022-09-17 ENCOUNTER — Encounter: Payer: Self-pay | Admitting: Cardiology

## 2022-09-17 ENCOUNTER — Ambulatory Visit: Payer: Medicare HMO | Admitting: Cardiology

## 2022-09-17 ENCOUNTER — Other Ambulatory Visit: Payer: Medicare HMO

## 2022-09-17 VITALS — BP 134/77 | HR 74 | Resp 16 | Ht 72.0 in | Wt 158.2 lb

## 2022-09-17 DIAGNOSIS — R0609 Other forms of dyspnea: Secondary | ICD-10-CM | POA: Diagnosis not present

## 2022-09-17 DIAGNOSIS — Z01810 Encounter for preprocedural cardiovascular examination: Secondary | ICD-10-CM | POA: Diagnosis not present

## 2022-09-17 DIAGNOSIS — R9431 Abnormal electrocardiogram [ECG] [EKG]: Secondary | ICD-10-CM | POA: Diagnosis not present

## 2022-09-17 DIAGNOSIS — M7989 Other specified soft tissue disorders: Secondary | ICD-10-CM

## 2022-09-17 DIAGNOSIS — I493 Ventricular premature depolarization: Secondary | ICD-10-CM | POA: Diagnosis not present

## 2022-09-17 DIAGNOSIS — R600 Localized edema: Secondary | ICD-10-CM | POA: Diagnosis not present

## 2022-09-17 NOTE — Progress Notes (Signed)
ID:  Gregory Gomez, DOB [birth year], MRN [id number]  PCP:  Shirlean Mylar, MD  Cardiologist:  Tessa Lerner, DO, [facility] (established care 09/17/22)  REASON FOR CONSULT: Shortness of breath  REQUESTING PHYSICIAN:  Shirlean Mylar, MD [address] [place],  [place] [address]  Chief Complaint  Patient presents with   Shortness of Breath   New Patient (Initial Visit)    HPI  Gregory Gomez is a 72 y.o.  male who presents to the clinic for evaluation of shortness of breath at the request of Shirlean Mylar, MD. His past medical history and cardiovascular risk factors include: Dementia, Hx Prostate Cancer.  Patient was referred to the practice for evaluation of shortness of breath.  Due to underlying dementia majority of the HPI is obtained by his daughter that is accompanying him today.  Patient has been having lower extremity swelling intermittently since February 2024.  He had a lower extremity venous duplex which was negative for DVT.  Though the swelling is improving he is also now experiencing dyspnea on exertion since April 2024.  Given the symptoms he is referred to cardiology for further evaluation and management.  Of note, he is scheduled for colonoscopy for evaluation of " internal bleeding" per his daughter.  Colonoscopy is currently scheduled for October 04, 2022.  Patient denies anginal chest pain, orthopnea, paroxysmal nocturnal dyspnea.  No significant change in physical endurance though limited.  He walks no more than half a mile per day going back and forth between house to mailbox.  FUNCTIONAL STATUS: Walks about half mile per day.    CARDIAC DATABASE: EKG: September 17, 2022: Sinus rhythm, 62 bpm, frequent PVCs, nonspecific T changes.  No prior EKGs available for review.  Echocardiogram: No results found for this or any previous visit from the past 1095 days.   Stress Testing: No results found for this or any previous visit from the past 1095  days.    ALLERGIES: No Known Allergies  MEDICATION LIST PRIOR TO VISIT: No outpatient medications have been marked as taking for the 09/17/22 encounter (Office Visit) with Tessa Lerner, DO.     PAST MEDICAL HISTORY: Past Medical History:  Diagnosis Date   Arrhythmia    Prostate cancer (HCC)     PAST SURGICAL HISTORY: Past Surgical History:  Procedure Laterality Date   radiation seed implants      FAMILY HISTORY: The patient family history includes Alzheimer's disease in his sister; CAD in his brother; Lung cancer in his mother.  SOCIAL HISTORY:  The patient  reports that he has never smoked. He has never used smokeless tobacco. He reports that he does not drink alcohol and does not use drugs.  REVIEW OF SYSTEMS: Review of Systems  Cardiovascular:  Positive for dyspnea on exertion and leg swelling. Negative for chest pain, claudication, irregular heartbeat, near-syncope, orthopnea, palpitations, paroxysmal nocturnal dyspnea and syncope.  Respiratory:  Positive for shortness of breath.   Hematologic/Lymphatic: Negative for bleeding problem.  Musculoskeletal:  Negative for muscle cramps and myalgias.  Gastrointestinal:        Daughter endorses the family has seen blood on his underwear  Neurological:  Negative for dizziness and light-headedness.    PHYSICAL EXAM:    09/17/2022   12:59 PM 01/13/2016    1:05 PM  Vitals with BMI  Height 6\' 0"  6\' 0"   Weight 158 lbs 3 oz 155 lbs  BMI 21.45 21.1  Systolic 134 140  Diastolic 77 76  Pulse 74  70    Physical Exam  Constitutional: No distress.  Age appropriate, hemodynamically stable.   Neck: No JVD present.  Cardiovascular: Normal rate, regular rhythm, S1 normal, S2 normal, intact distal pulses and normal pulses. Exam reveals no gallop, no S3 and no S4.  No murmur heard. Pulmonary/Chest: Effort normal and breath sounds normal. No stridor. He has no wheezes. He has no rales.  Abdominal: Soft. Bowel sounds are normal. He  exhibits no distension. There is no abdominal tenderness.  Musculoskeletal:        General: No edema.     Cervical back: Neck supple.  Neurological: He is alert and oriented to person, place, and time. He has intact cranial nerves (2-12).  Skin: Skin is warm and moist.   LABORATORY DATA:    Latest Ref Rng & Units 06/07/2008    9:10 AM  CBC  WBC 4.0 - 10.5 K/uL 4.8   Hemoglobin 13.0 - 17.0 g/dL 40.9   Hematocrit 81.1 - 52.0 % 38.0   Platelets 150 - 400 K/uL 176        Latest Ref Rng & Units 06/07/2008    9:10 AM  CMP  Glucose 70 - 99 mg/dL 914   BUN 6 - 23 mg/dL 12   Creatinine 0.4 - 1.5 mg/dL 7.82   Sodium 956 - 213 mEq/L 138   Potassium 3.5 - 5.1 mEq/L 4.0   Chloride 96 - 112 mEq/L 103   CO2 19 - 32 mEq/L 31   Calcium 8.4 - 10.5 mg/dL 9.0   Total Protein 6.0 - 8.3 g/dL 6.0   Total Bilirubin 0.3 - 1.2 mg/dL 0.7   Alkaline Phos 39 - 117 U/L 49   AST 0 - 37 U/L 15   ALT 0 - 53 U/L 14     No results found for: "CHOL", "HDL", "LDLCALC", "LDLDIRECT", "TRIG", "CHOLHDL" No components found for: "NTPROBNP" No results for input(s): "PROBNP" in the last 8760 hours. No results for input(s): "TSH" in the last 8760 hours.  BMP No results for input(s): "NA", "K", "CL", "CO2", "GLUCOSE", "BUN", "CREATININE", "CALCIUM", "GFRNONAA", "GFRAA" in the last 8760 hours.  HEMOGLOBIN A1C No results found for: "HGBA1C", "MPG"  IMPRESSION:    ICD-10-CM   1. Dyspnea on exertion  R06.09 EKG 12-Lead    Basic metabolic panel    Magnesium    ECHOCARDIOGRAM COMPLETE    Pro b natriuretic peptide (BNP)    Hemoglobin and hematocrit, blood    2. Leg swelling  M79.89     3. PVC's (premature ventricular contractions)  I49.3 LONG TERM MONITOR (3-14 DAYS)    4. Preprocedural cardiovascular examination  Z01.810     5. Abnormal electrocardiogram  R94.31 ECHOCARDIOGRAM COMPLETE    PCV MYOCARDIAL PERFUSION WITH LEXISCAN       RECOMMENDATIONS: Gregory Gomez is a 72 y.o.  male whose past  medical history and cardiac risk factors include: Dementia, Hx Prostate Cancer.  Dyspnea on exertion Leg swelling Initially started as lower extremity swelling which is intermittent and has noted dyspnea on exertion since April 2024. Clinically not in florid heart failure. Overall history of present illness is limited for reasons mentioned above. Will check hemoglobin and hematocrit given his history of bleeding. Check BNP. Echo will be ordered to evaluate for structural heart disease and left ventricular systolic function. Plan pharmacological stress to evaluate for reversible ischemia.  PVC's (premature ventricular contractions) Zio patch to evaluate for dysrhythmias/PVC burden  Preprocedural cardiovascular examination Tentatively scheduled for colonoscopy October 04, 2022  Further recommendations to follow.   FINAL MEDICATION LIST END OF ENCOUNTER: No orders of the defined types were placed in this encounter.   Medications Discontinued During This Encounter  Medication Reason   escitalopram (LEXAPRO) 10 MG tablet     No current outpatient medications on file.  Orders Placed This Encounter  Procedures   Basic metabolic panel   Magnesium   Pro b natriuretic peptide (BNP)   Hemoglobin and hematocrit, blood   LONG TERM MONITOR (3-14 DAYS)   PCV MYOCARDIAL PERFUSION WITH LEXISCAN   EKG 12-Lead   ECHOCARDIOGRAM COMPLETE    There are no Patient Instructions on file for this visit.   --Continue cardiac medications as reconciled in final medication list. --Return in about 4 weeks (around 10/15/2022) for Follow up, Dyspnea, Review test results. or sooner if needed. --Continue follow-up with your primary care physician regarding the management of your other chronic comorbid conditions.  Patient's questions and concerns were addressed to his satisfaction. He voices understanding of the instructions provided during this encounter.   This note was created using a voice recognition  software as a result there may be grammatical errors inadvertently enclosed that do not reflect the nature of this encounter. Every attempt is made to correct such errors.  Tessa Lerner, [place], [facility]  Pager:  [phone] Office: [phone]

## 2022-09-18 ENCOUNTER — Other Ambulatory Visit: Payer: Self-pay

## 2022-09-18 DIAGNOSIS — R0609 Other forms of dyspnea: Secondary | ICD-10-CM

## 2022-09-18 LAB — MAGNESIUM: Magnesium: 2.4 mg/dL — ABNORMAL HIGH (ref 1.6–2.3)

## 2022-09-18 LAB — BASIC METABOLIC PANEL
BUN/Creatinine Ratio: 9 — ABNORMAL LOW (ref 10–24)
BUN: 16 mg/dL (ref 8–27)
CO2: 21 mmol/L (ref 20–29)
Calcium: 9.5 mg/dL (ref 8.6–10.2)
Chloride: 104 mmol/L (ref 96–106)
Creatinine, Ser: 1.73 mg/dL — ABNORMAL HIGH (ref 0.76–1.27)
Glucose: 105 mg/dL — ABNORMAL HIGH (ref 70–99)
Potassium: 4.7 mmol/L (ref 3.5–5.2)
Sodium: 141 mmol/L (ref 134–144)
eGFR: 42 mL/min/{1.73_m2} — ABNORMAL LOW (ref >59)

## 2022-09-18 LAB — HEMOGLOBIN AND HEMATOCRIT, BLOOD
Hematocrit: 37.4 % — ABNORMAL LOW (ref 37.5–51.0)
Hemoglobin: 12.2 g/dL — ABNORMAL LOW (ref 13.0–17.7)

## 2022-09-18 LAB — PRO B NATRIURETIC PEPTIDE: NT-Pro BNP: 479 pg/mL — ABNORMAL HIGH (ref 0–376)

## 2022-09-24 ENCOUNTER — Ambulatory Visit: Payer: Medicare HMO

## 2022-09-24 ENCOUNTER — Other Ambulatory Visit: Payer: Medicare HMO

## 2022-09-24 DIAGNOSIS — R9431 Abnormal electrocardiogram [ECG] [EKG]: Secondary | ICD-10-CM

## 2022-09-25 ENCOUNTER — Ambulatory Visit (HOSPITAL_COMMUNITY)
Admission: RE | Admit: 2022-09-25 | Discharge: 2022-09-25 | Disposition: A | Discharge status: Home / Self Care | Payer: Medicare HMO | Source: Ambulatory Visit | Attending: Cardiology | Admitting: Cardiology

## 2022-09-25 DIAGNOSIS — Z8546 Personal history of malignant neoplasm of prostate: Secondary | ICD-10-CM | POA: Insufficient documentation

## 2022-09-25 DIAGNOSIS — R9431 Abnormal electrocardiogram [ECG] [EKG]: Secondary | ICD-10-CM

## 2022-09-25 DIAGNOSIS — G309 Alzheimer's disease, unspecified: Secondary | ICD-10-CM | POA: Insufficient documentation

## 2022-09-25 DIAGNOSIS — R609 Edema, unspecified: Secondary | ICD-10-CM | POA: Insufficient documentation

## 2022-09-25 DIAGNOSIS — R0609 Other forms of dyspnea: Secondary | ICD-10-CM | POA: Diagnosis not present

## 2022-09-25 DIAGNOSIS — R9341 Abnormal radiologic findings on diagnostic imaging of renal pelvis, ureter, or bladder: Secondary | ICD-10-CM | POA: Insufficient documentation

## 2022-09-25 DIAGNOSIS — F0280 Dementia in other diseases classified elsewhere without behavioral disturbance: Secondary | ICD-10-CM | POA: Insufficient documentation

## 2022-09-25 DIAGNOSIS — I340 Nonrheumatic mitral (valve) insufficiency: Secondary | ICD-10-CM | POA: Diagnosis not present

## 2022-09-25 LAB — ECHOCARDIOGRAM COMPLETE
AR max vel: 2.2 cm2
AV Area VTI: 2.08 cm2
AV Area mean vel: 2.14 cm2
AV Mean grad: 3 mmHg
AV Peak grad: 5.5 mmHg
Ao pk vel: 1.17 m/s
Area-P 1/2: 2.4 cm2
Calc EF: 47.7 %
MV M vel: 5.25 m/s
MV Peak grad: 110.3 mmHg
MV Vena cont: 0.2 cm
Radius: 0.23 cm
S' Lateral: 3.9 cm
Single Plane A2C EF: 49.6 %
Single Plane A4C EF: 46.3 %

## 2022-09-26 NOTE — Progress Notes (Signed)
Spoke with patient's daughter Ezequiel Kayser, regarding patient's recent lab results.

## 2022-09-28 ENCOUNTER — Encounter: Payer: Self-pay | Admitting: Cardiology

## 2022-09-28 NOTE — Progress Notes (Signed)
Lvm for a call back

## 2022-09-28 NOTE — Progress Notes (Signed)
Lvm for a call back, echo also

## 2022-10-01 ENCOUNTER — Telehealth: Payer: Self-pay

## 2022-10-01 NOTE — Progress Notes (Signed)
Pts daughter called back results given

## 2022-10-01 NOTE — Progress Notes (Signed)
Pts daughter called back results given

## 2022-10-01 NOTE — Telephone Encounter (Signed)
Pt took monitor off, didn't even wear it for 24 hours according to hs daughter.

## 2022-10-02 ENCOUNTER — Telehealth: Payer: Self-pay

## 2022-10-02 NOTE — Telephone Encounter (Signed)
Please document that he was only able to wear it for no more than 24hr.  Dr. Odis Hollingshead

## 2022-10-02 NOTE — Telephone Encounter (Signed)
Patient only wore monitor for 24 hours , his daughter called to let us know, ST is aware

## 2022-10-04 DIAGNOSIS — K625 Hemorrhage of anus and rectum: Secondary | ICD-10-CM | POA: Diagnosis not present

## 2022-10-18 ENCOUNTER — Encounter: Payer: Self-pay | Admitting: Cardiology

## 2022-10-18 ENCOUNTER — Ambulatory Visit: Payer: Medicare HMO | Admitting: Cardiology

## 2022-10-18 ENCOUNTER — Other Ambulatory Visit: Payer: Self-pay | Admitting: Cardiology

## 2022-10-18 VITALS — BP 154/91 | HR 78 | Resp 16 | Ht 72.0 in | Wt 166.8 lb

## 2022-10-18 DIAGNOSIS — I5032 Chronic diastolic (congestive) heart failure: Secondary | ICD-10-CM | POA: Diagnosis not present

## 2022-10-18 DIAGNOSIS — I493 Ventricular premature depolarization: Secondary | ICD-10-CM | POA: Diagnosis not present

## 2022-10-18 DIAGNOSIS — D649 Anemia, unspecified: Secondary | ICD-10-CM

## 2022-10-18 DIAGNOSIS — R0609 Other forms of dyspnea: Secondary | ICD-10-CM

## 2022-10-18 MED ORDER — TORSEMIDE 10 MG PO TABS: 10.0000 mg | ORAL_TABLET | Freq: Every morning | ORAL | 0 refills | Status: DC

## 2022-10-18 MED ORDER — LOSARTAN POTASSIUM 25 MG PO TABS: 25.0000 mg | ORAL_TABLET | Freq: Every day | ORAL | 0 refills | Status: DC

## 2022-10-18 NOTE — Progress Notes (Signed)
ID:  Gregory Gomez, DOB 1950/11/02, MRN [id number]  PCP:  Shirlean Mylar, MD  Cardiologist:  Tessa Lerner, DO, [facility] (established care 09/17/2022)  Date: 10/18/22 Last Office Visit: 09/17/2022  Chief Complaint  Patient presents with   Follow-up   Shortness of Breath    HPI  Gregory Gomez is a 72 y.o.  male whose past medical history and cardiovascular risk factors include: Chronic HFpEF, hypertension, dementia, Hx Prostate Cancer.  Patient was referred to the practice for shortness of breath.  Given his underlying dementia majority of the history of present illness is obtained by talking to her daughter Gregory Gomez over the phone and Gregory Gomez who accompanies him.  At last office visit patient stated that he was experiencing dyspnea on exertion since April 2024 the symptoms were getting progressively worse and therefore he was referred to cardiology for further evaluation and management.  Echocardiogram notes low normal LVEF at 50-55% with indeterminate diastolic dysfunction, severely dilated left atrium, no significant valvular heart disease.  Pharmacological stress test was reported to be low risk.  Clinically patient states that the dyspnea on exertion has improved and lower extremity swelling is less notable.  Lab work notes mildly elevated NT proBNP with chronic kidney disease stage III.  He also underwent colonoscopy since last office visit and according to the daughters no interventions were performed.  Zio patch results are still pending.  FUNCTIONAL STATUS: Walks about half mile per day.    CARDIAC DATABASE: EKG: September 17, 2022: Sinus rhythm, 62 bpm, frequent PVCs, nonspecific T changes.  No prior EKGs available for review.  Echocardiogram: 09/25/2022 1. Left ventricular ejection fraction, by estimation, is 50 to 55%. The left ventricle has low normal function. The left ventricle has no regional wall motion abnormalities. Left ventricular diastolic parameters are  indeterminate. 2. Right ventricular systolic function is normal. The right ventricular size is normal. 3. Left atrial size was severely dilated. 4. Right atrial size was mild to moderately dilated. 5. The mitral valve is normal in structure. Mild mitral valve regurgitation. 6. The aortic valve is grossly normal. Aortic valve regurgitation is not visualized. 7. The inferior vena cava is normal in size with greater than 50% respiratory variability, suggesting right atrial pressure of 3 mmHg.   Stress Testing: Lexiscan Tetrofosmin (Walking) stress test 09/24/2022: 1 Day Rest/Stress protocol.  Exercise time 4 minutes 00 seconds on Bruce protocol, achieved 2.30 METS, 64% of APMHR. Stress ECG nondiagnostic for ischemia - due to baseline ST-T changes and target heart rate not achieved.  Small sized, mild intensity, fixed perfusion defect involving the basal inferior and inferolateral segments likely secondary to diaphragmatic attenuation.  Otherwise no obvious reversible myocardial ischemia or prior infarct. Left ventricular size is normal, no obvious regional wall motion abnormalities, and Calculated LVEF 59% No prior studies available for comparison. Low risk study     ALLERGIES: No Known Allergies  MEDICATION LIST PRIOR TO VISIT: Current Meds  Medication Sig   losartan (COZAAR) 25 MG tablet Take 1 tablet (25 mg total) by mouth daily at 10 pm.   torsemide (DEMADEX) 10 MG tablet Take 1 tablet (10 mg total) by mouth every morning.     PAST MEDICAL HISTORY: Past Medical History:  Diagnosis Date   Arrhythmia    Prostate cancer (HCC)     PAST SURGICAL HISTORY: Past Surgical History:  Procedure Laterality Date   radiation seed implants      FAMILY HISTORY: The patient family history includes Alzheimer's disease in his  sister; CAD in his brother; Lung cancer in his mother.  SOCIAL HISTORY:  The patient  reports that he has never smoked. He has never used smokeless tobacco. He  reports that he does not drink alcohol and does not use drugs.  REVIEW OF SYSTEMS: Review of Systems  Cardiovascular:  Positive for dyspnea on exertion (improving) and leg swelling (improved). Negative for chest pain, claudication, irregular heartbeat, near-syncope, orthopnea, palpitations, paroxysmal nocturnal dyspnea and syncope.  Respiratory:  Positive for shortness of breath.   Hematologic/Lymphatic: Negative for bleeding problem.  Musculoskeletal:  Negative for muscle cramps and myalgias.  Gastrointestinal:        Daughter endorses the family has seen blood on his underwear  Neurological:  Negative for dizziness and light-headedness.    PHYSICAL EXAM:    10/18/2022    1:57 PM 09/17/2022   12:59 PM 01/13/2016    1:05 PM  Vitals with BMI  Height 6\' 0"  6\' 0"  6\' 0"   Weight 166 lbs 13 oz 158 lbs 3 oz 155 lbs  BMI 22.62 21.45 21.1  Systolic 154 134 161  Diastolic 91 77 76  Pulse 78 74 70    Physical Exam  Constitutional: No distress.  Age appropriate, hemodynamically stable.   Neck: No JVD present.  Cardiovascular: Normal rate, regular rhythm, S1 normal, S2 normal, intact distal pulses and normal pulses. Exam reveals no gallop, no S3 and no S4.  No murmur heard. Pulmonary/Chest: Effort normal and breath sounds normal. No stridor. He has no wheezes. He has no rales.  Abdominal: Soft. Bowel sounds are normal. He exhibits no distension. There is no abdominal tenderness.  Musculoskeletal:        General: No edema.     Cervical back: Neck supple.  Neurological: He is alert and oriented to person, place, and time. He has intact cranial nerves (2-12).  Skin: Skin is warm and moist.   LABORATORY DATA:    Latest Ref Rng & Units 09/17/2022    2:21 PM 06/07/2008    9:10 AM  CBC  WBC 4.0 - 10.5 K/uL  4.8   Hemoglobin 13.0 - 17.7 g/dL 09.6  04.5   Hematocrit 37.5 - 51.0 % 37.4  38.0   Platelets 150 - 400 K/uL  176        Latest Ref Rng & Units 09/17/2022    2:21 PM 06/07/2008     9:10 AM  CMP  Glucose 70 - 99 mg/dL 409  811   BUN 8 - 27 mg/dL 16  12   Creatinine 9.14 - 1.27 mg/dL 7.82  9.56   Sodium 213 - 144 mmol/L 141  138   Potassium 3.5 - 5.2 mmol/L 4.7  4.0   Chloride 96 - 106 mmol/L 104  103   CO2 20 - 29 mmol/L 21  31   Calcium 8.6 - 10.2 mg/dL 9.5  9.0   Total Protein 6.0 - 8.3 g/dL  6.0   Total Bilirubin 0.3 - 1.2 mg/dL  0.7   Alkaline Phos 39 - 117 U/L  49   AST 0 - 37 U/L  15   ALT 0 - 53 U/L  14     No results found for: "CHOL", "HDL", "LDLCALC", "LDLDIRECT", "TRIG", "CHOLHDL" No components found for: "NTPROBNP" Recent Labs    09/17/22 1421  PROBNP 479*   No results for input(s): "TSH" in the last 8760 hours.  BMP Recent Labs    09/17/22 1421  NA 141  K 4.7  CL  104  CO2 21  GLUCOSE 105*  BUN 16  CREATININE 1.73*  CALCIUM 9.5    HEMOGLOBIN A1C No results found for: "HGBA1C", "MPG"  IMPRESSION:    ICD-10-CM   1. Chronic heart failure with preserved ejection fraction (HFpEF) (HCC)  I50.32 torsemide (DEMADEX) 10 MG tablet    losartan (COZAAR) 25 MG tablet    Pro b natriuretic peptide (BNP)    Basic metabolic panel    2. Dyspnea on exertion  R06.09     3. Anemia, unspecified type  D64.9     4. PVC's (premature ventricular contractions)  I49.3         RECOMMENDATIONS: Gregory Gomez is a 72 y.o.  male whose past medical history and cardiac risk factors include: Dementia, Hx Prostate Cancer.  Chronic heart failure with preserved ejection fraction (HFpEF) (HCC) Stage B, NYHA class II. Echocardiogram notes low normal LVEF, indeterminate diastolic function, no significant valvular heart disease. Stress test overall low risk. Clinically has been experiencing dyspnea on exertion as well as lower extremity swelling and NT proBNP is mildly elevated. Given his dementia and family goals of care will avoid aggressive GDMT. Given his comorbidities would benefit from low-dose ARB and diuretic therapy. Family is  agreeable. Start torsemide 10 mg p.o. every morning. Start losartan 25 mg p.o. every afternoon. Labs in 1 week to evaluate kidney function and electrolytes  Anemia, unspecified type Did undergo colonoscopy since last office visit and was noted to have hemorrhoidal bleeding. No formal report available for review.  This is per his daughters.  PVC's (premature ventricular contractions) Requested a Zio patch to evaluate for PVC burden. Results are still pending.  Would like to see him back in 3 months or sooner if change in clinical status.  FINAL MEDICATION LIST END OF ENCOUNTER: Meds ordered this encounter  Medications   torsemide (DEMADEX) 10 MG tablet    Sig: Take 1 tablet (10 mg total) by mouth every morning.    Dispense:  90 tablet    Refill:  0   losartan (COZAAR) 25 MG tablet    Sig: Take 1 tablet (25 mg total) by mouth daily at 10 pm.    Dispense:  90 tablet    Refill:  0    There are no discontinued medications.    Current Outpatient Medications:    losartan (COZAAR) 25 MG tablet, Take 1 tablet (25 mg total) by mouth daily at 10 pm., Disp: 90 tablet, Rfl: 0   torsemide (DEMADEX) 10 MG tablet, Take 1 tablet (10 mg total) by mouth every morning., Disp: 90 tablet, Rfl: 0  Orders Placed This Encounter  Procedures   Pro b natriuretic peptide (BNP)   Basic metabolic panel    There are no Patient Instructions on file for this visit.   --Continue cardiac medications as reconciled in final medication list. --Return in about 3 months (around 01/18/2023) for Follow up HFpEF. or sooner if needed. --Continue follow-up with your primary care physician regarding the management of your other chronic comorbid conditions.  Patient's questions and concerns were addressed to his satisfaction. He voices understanding of the instructions provided during this encounter.   This note was created using a voice recognition software as a result there may be grammatical errors inadvertently  enclosed that do not reflect the nature of this encounter. Every attempt is made to correct such errors.  Tessa Lerner, [place], [facility]  Pager:  [phone] Office: [phone]

## 2022-10-21 ENCOUNTER — Other Ambulatory Visit: Payer: Self-pay | Admitting: Cardiology

## 2022-10-21 DIAGNOSIS — I5032 Chronic diastolic (congestive) heart failure: Secondary | ICD-10-CM

## 2022-10-22 NOTE — Progress Notes (Signed)
Called patient no answer left a vm

## 2022-10-25 NOTE — Progress Notes (Signed)
Called patient no answer left a vm

## 2022-12-19 ENCOUNTER — Telehealth: Payer: Self-pay | Admitting: Cardiology

## 2022-12-19 NOTE — Telephone Encounter (Signed)
Requested replacement return mailing box to be shipped to patients daughter:  Gregory Gomez [address] [place], [place]  [address]

## 2022-12-19 NOTE — Telephone Encounter (Signed)
Daughter states that she cannot return her father's Zio Monitor because the return label got wet. She wants to know what she needs to do. Please advise.

## 2023-01-13 ENCOUNTER — Other Ambulatory Visit: Payer: Self-pay | Admitting: Cardiology

## 2023-01-13 DIAGNOSIS — I5032 Chronic diastolic (congestive) heart failure: Secondary | ICD-10-CM

## 2023-01-18 ENCOUNTER — Ambulatory Visit: Payer: Self-pay | Admitting: Cardiology

## 2023-02-12 ENCOUNTER — Encounter: Payer: Self-pay | Admitting: Cardiology

## 2023-02-12 ENCOUNTER — Ambulatory Visit: Payer: Medicare HMO | Attending: Cardiology | Admitting: Cardiology

## 2023-02-12 VITALS — BP 148/86 | HR 71 | Resp 16 | Ht 72.0 in | Wt 170.0 lb

## 2023-02-12 DIAGNOSIS — I493 Ventricular premature depolarization: Secondary | ICD-10-CM

## 2023-02-12 DIAGNOSIS — I5032 Chronic diastolic (congestive) heart failure: Secondary | ICD-10-CM | POA: Diagnosis not present

## 2023-02-12 NOTE — Progress Notes (Signed)
Cardiology Office Note:  .   Date:  02/12/2023  ID:  Gregory Gomez, DOB 11/15/1950, MRN [id number] PCP:  Shirlean Mylar, MD  Former Cardiology Providers: [facility] Providers Cardiologist:  Tessa Lerner, DO , [facility] (established care June 2024) Electrophysiologist:  None  Click to update primary MD,subspecialty MD or APP then REFRESH:1}    Chief Complaint  Patient presents with   Chronic heart failure with preserved ejection fraction    Follow-up    History of Present Illness: .   Gregory Gomez is a 72 y.o. Caucasian male whose past medical history and cardiovascular risk factors includes: Chronic HFpEF, hypertension, dementia, history of prostate cancer.  Patient is accompanied by his daughter Ezequiel Kayser and provides majority of HPI given the fact that he has dementia.  Patient was experiencing dyspnea as of April 2024 and symptoms are getting progressively worse and therefore he was referred to cardiology for further evaluation and management.  Patient was diagnosed with chronic HFpEF.  Due to goals of care family did not want to be aggressive with GDMT.  Patient was started on torsemide and losartan.  He presents today for follow-up.  According to daughter shortness of breath is significantly improved and no longer experienced lower extremity swelling.  They do not check his home blood pressures at the current time.    Review of Systems: .   Review of Systems  Cardiovascular:  Positive for dyspnea on exertion. Negative for chest pain, claudication, irregular heartbeat, near-syncope, orthopnea, palpitations, paroxysmal nocturnal dyspnea and syncope.  Hematologic/Lymphatic: Negative for bleeding problem.  Musculoskeletal:  Negative for muscle cramps and myalgias.  Gastrointestinal:        Daughter endorses the family has seen blood on his underwear  Neurological:  Negative for dizziness and light-headedness.    Studies Reviewed:   EKG: September 17, 2022: Sinus rhythm,  62 bpm, frequent PVCs, nonspecific T changes. No prior EKGs available for review.   Echocardiogram: July 2024: LVEF 50 to 55%. Right ventricular size and function normal. Severe dilatation of the left atrium. Mild to moderate dilatation of the right atrium. Mild MR Estimated RAP 3 mmHg  Stress Testing: Lexiscan Tetrofosmin (Walking) stress test 09/24/2022:  Low risk study  Cardiac monitor: Zio patch -presumed to be lost in the mail.  Of note there is a 3-day monitor but he only able to wear for 24 hours or less.  RADIOLOGY: NA  Risk Assessment/Calculations:   NA   Labs:       Latest Ref Rng & Units 09/17/2022    2:21 PM 06/07/2008    9:10 AM  CBC  WBC 4.0 - 10.5 K/uL  4.8   Hemoglobin 13.0 - 17.7 g/dL 46.9  62.9   Hematocrit 37.5 - 51.0 % 37.4  38.0   Platelets 150 - 400 K/uL  176        Latest Ref Rng & Units 10/18/2022    3:24 PM 09/17/2022    2:21 PM 06/07/2008    9:10 AM  BMP  Glucose 70 - 99 mg/dL 96  528  413   BUN 8 - 27 mg/dL 11  16  12    Creatinine 0.76 - 1.27 mg/dL 2.44  0.10  2.72   BUN/Creat Ratio 10 - 24 8  9     Sodium 134 - 144 mmol/L 139  141  138   Potassium 3.5 - 5.2 mmol/L 4.3  4.7  4.0   Chloride 96 - 106 mmol/L 101  104  103   CO2 20 - 29 mmol/L 23  21  31    Calcium 8.6 - 10.2 mg/dL 8.8  9.5  9.0       Latest Ref Rng & Units 10/18/2022    3:24 PM 09/17/2022    2:21 PM 06/07/2008    9:10 AM  CMP  Glucose 70 - 99 mg/dL 96  253  664   BUN 8 - 27 mg/dL 11  16  12    Creatinine 0.76 - 1.27 mg/dL 4.03  4.74  2.59   Sodium 134 - 144 mmol/L 139  141  138   Potassium 3.5 - 5.2 mmol/L 4.3  4.7  4.0   Chloride 96 - 106 mmol/L 101  104  103   CO2 20 - 29 mmol/L 23  21  31    Calcium 8.6 - 10.2 mg/dL 8.8  9.5  9.0   Total Protein 6.0 - 8.3 g/dL   6.0   Total Bilirubin 0.3 - 1.2 mg/dL   0.7   Alkaline Phos 39 - 117 U/L   49   AST 0 - 37 U/L   15   ALT 0 - 53 U/L   14     No results found for: "CHOL", "HDL", "LDLCALC", "LDLDIRECT", "TRIG",  "CHOLHDL" No results for input(s): "LIPOA" in the last 8760 hours. No components found for: "NTPROBNP" Recent Labs    09/17/22 1421 10/18/22 1524  PROBNP 479* 809*   No results for input(s): "TSH" in the last 8760 hours.    Physical Exam:    Today's Vitals   02/12/23 1315  BP: (!) 148/86  Pulse: 71  Resp: 16  SpO2: 99%  Weight: 170 lb (77.1 kg)  Height: 6' (1.829 m)   Body mass index is 23.06 kg/m. Wt Readings from Last 3 Encounters:  02/12/23 170 lb (77.1 kg)  10/18/22 166 lb 12.8 oz (75.7 kg)  09/17/22 158 lb 3.2 oz (71.8 kg)    Physical Exam  Constitutional: No distress.  Age appropriate, hemodynamically stable.   Neck: No JVD present.  Cardiovascular: Normal rate, regular rhythm, S1 normal, S2 normal, intact distal pulses and normal pulses. Exam reveals no gallop, no S3 and no S4.  No murmur heard. Pulmonary/Chest: Effort normal and breath sounds normal. No stridor. He has no wheezes. He has no rales.  Abdominal: Soft. Bowel sounds are normal. He exhibits no distension. There is no abdominal tenderness.  Musculoskeletal:        General: No edema.     Cervical back: Neck supple.  Neurological: He is alert and oriented to person, place, and time. He has intact cranial nerves (2-12).  Skin: Skin is warm and moist.     Impression & Recommendation(s):  Impression:   ICD-10-CM   1. Chronic heart failure with preserved ejection fraction (HFpEF) (HCC)  I50.32     2. PVC's (premature ventricular contractions)  I49.3        Recommendation(s):  Chronic heart failure with preserved ejection fraction (HFpEF) (HCC) Stage B, NYHA class II. LVEF as per the most recent echocardiogram is low normal. Clinically patient was having shortness of breath, lower extremity swelling, elevated BNP levels. He was started on ARB and torsemide at the last office visit both of those medications he is tolerated well without any side effects or intolerances. His daughter states that  they have been " easing him in" giving him medications several times a week but not every day. Given the fact that the blood pressures are  still elevated would recommend regular administration of medications to see if further medication titration is warranted. I have also requested them to keep a log of his blood pressures and weights. In the past patient's family did not want to be aggressive with GDMT given his underlying dementia. Further recommendations to follow  PVC's (premature ventricular contractions) Was recommended to have a Zio patch to evaluate for PVC burden burden. However, in 3-day Zio patch was only worn for 24 hours or less. The reports are not available and likely presumed to be lost in transition.   Will reach out to a Zio patch for further recommendations.   Orders Placed:  No orders of the defined types were placed in this encounter.   Final Medication List:   No orders of the defined types were placed in this encounter.   There are no discontinued medications.   Current Outpatient Medications:    losartan (COZAAR) 25 MG tablet, Take 1 tablet (25 mg total) by mouth daily at 10 pm., Disp: 90 tablet, Rfl: 2   torsemide (DEMADEX) 10 MG tablet, Take 1 tablet (10 mg total) by mouth every morning., Disp: 90 tablet, Rfl: 0  Consent:   N/A  Disposition:   6 months or sooner if needed Patient may be asked to follow-up sooner based on the results of the above-mentioned testing.  His questions and concerns were addressed to his satisfaction. He voices understanding of the recommendations provided during this encounter.    Signed, Tessa Lerner, DO, [facility] [place]  [facility]  [address] [place], [place] [address] 02/12/2023 2:06 PM

## 2023-02-12 NOTE — Patient Instructions (Signed)
Medication Instructions:  Your physician recommends that you continue on your current medications as directed. Please refer to the Current Medication list given to you today.  *If you need a refill on your cardiac medications before your next appointment, please call your pharmacy*  Lab Work: None ordered today. If you have labs (blood work) drawn today and your tests are completely normal, you will receive your results only by: MyChart Message (if you have MyChart) OR A paper copy in the mail If you have any lab test that is abnormal or we need to change your treatment, we will call you to review the results.  Testing/Procedures: None ordered today.  Follow-Up: At [facility], you and your health needs are our priority.  As part of our continuing mission to provide you with exceptional heart care, we have created designated Provider Care Teams.  These Care Teams include your primary Cardiologist (physician) and Advanced Practice Providers (APPs -  Physician Assistants and Nurse Practitioners) who all work together to provide you with the care you need, when you need it.  We recommend signing up for the patient portal called "MyChart".  Sign up information is provided on this After Visit Summary.  MyChart is used to connect with patients for Virtual Visits (Telemedicine).  Patients are able to view lab/test results, encounter notes, upcoming appointments, etc.  Non-urgent messages can be sent to your provider as well.   To learn more about what you can do with MyChart, go to [email or web address].    Your next appointment:   6 month(s)  The format for your next appointment:   In Person  Provider:   Tessa Lerner, DO {

## 2023-02-13 DIAGNOSIS — I493 Ventricular premature depolarization: Secondary | ICD-10-CM | POA: Diagnosis not present

## 2023-04-09 ENCOUNTER — Ambulatory Visit: Payer: Medicare HMO | Admitting: Podiatry

## 2023-04-09 ENCOUNTER — Encounter: Payer: Self-pay | Admitting: Podiatry

## 2023-04-09 DIAGNOSIS — M2141 Flat foot [pes planus] (acquired), right foot: Secondary | ICD-10-CM | POA: Diagnosis not present

## 2023-04-09 DIAGNOSIS — B351 Tinea unguium: Secondary | ICD-10-CM

## 2023-04-09 DIAGNOSIS — M2011 Hallux valgus (acquired), right foot: Secondary | ICD-10-CM

## 2023-04-09 DIAGNOSIS — M2142 Flat foot [pes planus] (acquired), left foot: Secondary | ICD-10-CM

## 2023-04-09 DIAGNOSIS — M79675 Pain in left toe(s): Secondary | ICD-10-CM

## 2023-04-09 DIAGNOSIS — M79674 Pain in right toe(s): Secondary | ICD-10-CM | POA: Diagnosis not present

## 2023-04-09 DIAGNOSIS — M2012 Hallux valgus (acquired), left foot: Secondary | ICD-10-CM | POA: Diagnosis not present

## 2023-04-09 NOTE — Progress Notes (Signed)
 Subjective: Gregory Gomez presents today referred by Douglass Ivanoff, MD for complaint with chief concern of elongated, thickened, painful, discolored toenails for months. Patient has tried none. Chief Complaint  Patient presents with   Nail Problem    RFC patient last seen PCP in March of last year , patient wanted to talk to doctor about skin being different colors on his bilateral feet, patient knows who his PCP is    Past Medical History:  Diagnosis Date   (HFpEF) heart failure with preserved ejection fraction (HCC)    Arrhythmia    Hypertension    Prostate cancer [facility])    Past Surgical History:  Procedure Laterality Date   radiation seed implants       Current Outpatient Medications on File Prior to Visit  Medication Sig Dispense Refill   losartan  (COZAAR ) 25 MG tablet Take 1 tablet (25 mg total) by mouth daily at 10 pm. 90 tablet 2   torsemide  (DEMADEX ) 10 MG tablet Take 1 tablet (10 mg total) by mouth every morning. 90 tablet 0   No current facility-administered medications on file prior to visit.     No Known Allergies   Social History   Occupational History   Not on file  Tobacco Use   Smoking status: Never   Smokeless tobacco: Never  Substance and Sexual Activity   Alcohol use: No   Drug use: No   Sexual activity: Not on file     Family History  Problem Relation Age of Onset   Lung cancer Mother    Alzheimer's disease Sister    CAD Brother       There is no immunization history on file for this patient.   Objective: There were no vitals filed for this visit.  Gregory Gomez is a pleasant 73 y.o. male WD, WN in NAD. AAO x 3.  Vascular Examination: Vascular status intact b/l with palpable pedal pulses. Pedal hair present b/l. CFT immediate b/l. No edema. No pain with calf compression b/l. Skin temperature gradient WNL b/l. No cyanosis or clubbing noted b/l LE.  Neurological Examination: Sensation grossly intact b/l with 10 gram monofilament.  Vibratory sensation intact b/l.   Dermatological Examination: Pedal skin with normal turgor, texture and tone b/l. Toenails 1-5 b/l thick, discolored, elongated with subungual debris and pain on dorsal palpation. No hyperkeratotic lesions noted b/l. No hyperkeratotic nor porokeratotic lesions present on today's visit. Evidence of chronic venous insufficiency b/l lower extremities.  Musculoskeletal Examination: Muscle strength 5/5 to b/l LE. HAV with bunion deformity noted b/l LE. Pes planus deformity noted bilateral LE.  Radiographs: None  Assessment: 1. Pain due to onychomycosis of toenails of both feet   2. Pes planus of both feet   3. Hallux valgus, acquired, bilateral     Plan: -Patient was evaluated today. All questions/concerns addressed on today's visit. -Patient's family member present. All questions/concerns addressed on today's visit. -Discussed skin changes noted with CVI b/l LE.  -Discussed treatment options for onychomycosis. Patient opted for toenail debridement only on today.  -Toenails 1-5 b/l were debrided in length and girth with sterile nail nippers and dremel without iatrogenic bleeding.  -Shoe recommendations given for [place]. -Patient/POA to call should there be question/concern in the interim.  Return in about 3 months (around 07/08/2023).  Delon LITTIE Merlin, DPM      [place] LOCATION: [address]  [place], [place] [address]                   Office [phone]   [facility] LOCATION: [address] [place], [place] [address] Office [phone]

## 2023-07-08 ENCOUNTER — Encounter: Payer: Medicare HMO | Admitting: Podiatry

## 2023-07-08 NOTE — Progress Notes (Signed)
Patient did not show for scheduled appointment today.

## 2023-11-11 ENCOUNTER — Other Ambulatory Visit: Payer: Self-pay | Admitting: Cardiology

## 2023-11-11 DIAGNOSIS — I5032 Chronic diastolic (congestive) heart failure: Secondary | ICD-10-CM

## 2024-02-10 ENCOUNTER — Other Ambulatory Visit: Payer: Self-pay

## 2024-02-10 DIAGNOSIS — I5032 Chronic diastolic (congestive) heart failure: Secondary | ICD-10-CM

## 2024-02-12 ENCOUNTER — Other Ambulatory Visit: Payer: Self-pay | Admitting: Cardiology

## 2024-02-12 DIAGNOSIS — I5032 Chronic diastolic (congestive) heart failure: Secondary | ICD-10-CM

## 2024-02-13 MED ORDER — LOSARTAN POTASSIUM 25 MG PO TABS: 25.0000 mg | ORAL_TABLET | Freq: Every day | ORAL | 0 refills | Status: DC

## 2024-02-13 MED ORDER — TORSEMIDE 10 MG PO TABS: 10.0000 mg | ORAL_TABLET | Freq: Every morning | ORAL | 0 refills | Status: DC

## 2024-04-09 ENCOUNTER — Encounter: Payer: Self-pay | Admitting: Cardiology

## 2024-04-09 ENCOUNTER — Ambulatory Visit: Attending: Cardiology | Admitting: Cardiology

## 2024-04-09 VITALS — BP 134/74 | HR 62 | Ht 71.0 in | Wt 166.0 lb

## 2024-04-09 DIAGNOSIS — I5032 Chronic diastolic (congestive) heart failure: Secondary | ICD-10-CM

## 2024-04-09 DIAGNOSIS — I493 Ventricular premature depolarization: Secondary | ICD-10-CM | POA: Diagnosis not present

## 2024-04-09 LAB — BASIC METABOLIC PANEL WITH GFR
BUN/Creatinine Ratio: 9 — ABNORMAL LOW (ref 10–24)
BUN: 15 mg/dL (ref 8–27)
CO2: 22 mmol/L (ref 20–29)
Calcium: 9.1 mg/dL (ref 8.6–10.2)
Chloride: 103 mmol/L (ref 96–106)
Creatinine, Ser: 1.59 mg/dL — ABNORMAL HIGH (ref 0.76–1.27)
Glucose: 98 mg/dL (ref 70–99)
Potassium: 4.4 mmol/L (ref 3.5–5.2)
Sodium: 141 mmol/L (ref 134–144)
eGFR: 46 mL/min/1.73 — ABNORMAL LOW

## 2024-04-09 MED ORDER — TORSEMIDE 10 MG PO TABS: 10.0000 mg | ORAL_TABLET | Freq: Every morning | ORAL | 0 refills | Status: AC

## 2024-04-09 MED ORDER — LOSARTAN POTASSIUM 25 MG PO TABS: 25.0000 mg | ORAL_TABLET | Freq: Every day | ORAL | 0 refills | Status: AC

## 2024-04-09 NOTE — Patient Instructions (Addendum)
 Medication Instructions:  Your physician recommends that you continue on your current medications as directed. Please refer to the Current Medication list given to you today.  *If you need a refill on your cardiac medications before your next appointment, please call your pharmacy*  Lab Work: BMP today If you have labs (blood work) drawn today and your tests are completely normal, you will receive your results only by: MyChart Message (if you have MyChart) OR A paper copy in the mail If you have any lab test that is abnormal or we need to change your treatment, we will call you to review the results.  Testing/Procedures: None ordered  Follow-Up: At [facility], you and your health needs are our priority.  As part of our continuing mission to provide you with exceptional heart care, our providers are all part of one team.  This team includes your primary Cardiologist (physician) and Advanced Practice Providers or APPs (Physician Assistants and Nurse Practitioners) who all work together to provide you with the care you need, when you need it.  Your next appointment:   1 year(s)  Provider:   Madonna Large, DO    We recommend signing up for the patient portal called MyChart.  Sign up information is provided on this After Visit Summary.  MyChart is used to connect with patients for Virtual Visits (Telemedicine).  Patients are able to view lab/test results, encounter notes, upcoming appointments, etc.  Non-urgent messages can be sent to your provider as well.   To learn more about what you can do with MyChart, go to [email or web address].

## 2024-04-09 NOTE — Progress Notes (Signed)
 " Cardiology Office Note:  .   Date:  04/09/2024  ID:  Gregory Gomez, DOB December 13, 1950, MRN [id number] PCP:  Gregory Ivanoff, MD  Former Cardiology Providers: [facility] Providers Cardiologist:  Madonna Large, DO , [facility] (established care June 2024) Electrophysiologist:  None  Click to update primary MD,subspecialty MD or APP then REFRESH:1}    Chief Complaint  Patient presents with   Chronic heart failure with preserved ejection fraction    Follow-up    History of Present Illness: .   Gregory Gomez is a 74 y.o. Caucasian male whose past medical history and cardiovascular risk factors includes: Chronic HFpEF, hypertension, dementia, history of prostate cancer.  Patient is accompanied by his daughter [name] and provides majority of HPI given the fact that he has dementia.  Patient was experiencing dyspnea in April 2024 and symptoms are getting progressively worse and therefore he was referred to cardiology for further evaluation and management. Patient was diagnosed with chronic HFpEF.  Due to goals of care family did not want to be aggressive with GDMT.  Patient was started on torsemide  and losartan .  He has done well.   He presents today for follow up.   Since last office visit Gregory Gomez denies any anginal chest pain or heart failure symptoms.   No hospitalizations or urgent care visits for cardiovascular reasons.   He has been compliant with his medical therapy and endorses no concerns.  Physical endurance remains stable - 0.25 to 0.5 mile per day. Home SBP are well controlled  No evidence of bleeding - per daughter he had a colonoscopy and had a polyp removed.    Review of Systems: .   Review of Systems  Cardiovascular:  Negative for chest pain, claudication, dyspnea on exertion, irregular heartbeat, near-syncope, orthopnea, palpitations, paroxysmal nocturnal dyspnea and syncope.  Hematologic/Lymphatic: Negative for bleeding problem.  Musculoskeletal:  Negative for  muscle cramps and myalgias.  Neurological:  Negative for dizziness and light-headedness.    Studies Reviewed:   EKG: September 17, 2022: Sinus rhythm, 62 bpm, frequent PVCs, nonspecific T changes. No prior EKGs available for review.   EKG Interpretation Date/Time:  Thursday April 09 2024 08:23:12 EST Ventricular Rate:  74 PR Interval:  202 QRS Duration:  104 QT Interval:  388 QTC Calculation: 430 R Axis:   75  Text Interpretation: Sinus rhythm with sinus arrhythmia with occasional Premature ventricular complexes T wave abnormality, consider inferior ischemia When compared with ECG of 21-May-2008 09:11, Premature ventricular complexes are now Present Confirmed by Large Madonna [phone]) on 04/09/2024 8:26:38 AM  Echocardiogram: July 2024: LVEF 50 to 55%. Right ventricular size and function normal. Severe dilatation of the left atrium. Mild to moderate dilatation of the right atrium. Mild MR Estimated RAP 3 mmHg  Stress Testing: Lexiscan  Tetrofosmin  (Walking) stress test 09/24/2022:  Low risk study  Cardiac monitor: 08/2022  Dominant rhythm sinus.  Heart rate 57-102 bpm.  Avg HR 74 bpm. No atrial fibrillation detected during the monitoring period. No ventricular tachycardia, high grade AV block, pauses (3 seconds or longer). Total supraventricular ectopic burden 3.8% Total ventricular ectopic burden 3.7% Patient triggered events: 0.   RADIOLOGY: NA  Risk Assessment/Calculations:   NA   Labs:       Latest Ref Rng & Units 09/17/2022    2:21 PM 06/07/2008    9:10 AM  CBC  WBC 4.0 - 10.5 K/uL  4.8   Hemoglobin 13.0 - 17.7 g/dL 87.7  86.8   Hematocrit 37.5 -  51.0 % 37.4  38.0   Platelets 150 - 400 K/uL  176        Latest Ref Rng & Units 10/18/2022    3:24 PM 09/17/2022    2:21 PM 06/07/2008    9:10 AM  BMP  Glucose 70 - 99 mg/dL 96  894  891   BUN 8 - 27 mg/dL 11  16  12    Creatinine 0.76 - 1.27 mg/dL 8.57  8.26  8.65   BUN/Creat Ratio 10 - 24 8  9     Sodium 134 -  144 mmol/L 139  141  138   Potassium 3.5 - 5.2 mmol/L 4.3  4.7  4.0   Chloride 96 - 106 mmol/L 101  104  103   CO2 20 - 29 mmol/L 23  21  31    Calcium 8.6 - 10.2 mg/dL 8.8  9.5  9.0       Latest Ref Rng & Units 10/18/2022    3:24 PM 09/17/2022    2:21 PM 06/07/2008    9:10 AM  CMP  Glucose 70 - 99 mg/dL 96  894  891   BUN 8 - 27 mg/dL 11  16  12    Creatinine 0.76 - 1.27 mg/dL 8.57  8.26  8.65   Sodium 134 - 144 mmol/L 139  141  138   Potassium 3.5 - 5.2 mmol/L 4.3  4.7  4.0   Chloride 96 - 106 mmol/L 101  104  103   CO2 20 - 29 mmol/L 23  21  31    Calcium 8.6 - 10.2 mg/dL 8.8  9.5  9.0   Total Protein 6.0 - 8.3 g/dL   6.0   Total Bilirubin 0.3 - 1.2 mg/dL   0.7   Alkaline Phos 39 - 117 U/L   49   AST 0 - 37 U/L   15   ALT 0 - 53 U/L   14     No results found for: CHOL, HDL, LDLCALC, LDLDIRECT, TRIG, CHOLHDL No results for input(s): LIPOA in the last 8760 hours. No components found for: NTPROBNP No results for input(s): PROBNP in the last 8760 hours.  No results for input(s): TSH in the last 8760 hours.  External Labs: Collected: June 13 2022 TSH 1.34. Creatinine 1.6, BUN 15 eGFR 45 Sodium 136, potassium 3.5, chloride 107, bicarb 27 AST 15, ALT 16 Hemoglobin 11.4, hematocrit 34.9%  Hemoglobin A1c Reviewed date:08/02/2023 11:00:09 AM Interpretation:6.4 Performing Lab: Notes/Report: Testing Performed at: [facility], [address], [place], [place] [address] Add on 08-01-2023  eAG 137      Hgb A1c 6.4 4.8-5.6 % Prediabetes: 5.7-6.4%  Diabetes: >/= 6.5%   Lipid Panel Reviewed date:11/12/2023 03:42:20 PM Interpretation:LDL 86 Performing Lab: Notes/Report: Testing Performed at: [facility], [address], [address], [place], [place] [address]  Cholesterol 143 <200 mg/dL    CHOL/HDL 4.6 7.9-5.9 Ratio    HDLD 31 30-70 mg/dL Values below 40 mg/dL indicate increased risk factor  Triglyceride 142 0-199 mg/dL    NHDL 887 9-870 mg/dL  Range dependent upon risk factors.  LDL Chol Calc (NIH) 86 0-99 mg/dL    Vitamin B12 Reviewed date:11/12/2023 03:42:20 PM Interpretation:247 Performing Lab: Notes/Report: Testing Performed at: [facility], [address], [address], [place], [place] [address]  B12 247 180-914 pg/mL    CBC without Diff Reviewed date:08/02/2023 11:00:09 AM Interpretation:hgb 13; hct 38 Performing Lab: Notes/Report: Testing Performed at: [facility], [address], [address],  [place], [place] [address]  WBC 4.5 4.0-11.0 K/ul    RBC 4.56 4.20-5.80 M/uL    HGB 13.0 13.0-17.0 g/dL    HCT 61.9 60.9-47.9 %    MCV 83.2 80.0-94.0 fL    MCH 28.4 27.0-33.0 pg    MCHC 34.2 32.0-36.0 g/dL    RDW 85.5 88.4-84.4 %    PLT 205 150-400 K/uL    Comp Metabolic Panel Reviewed date:08/02/2023 11:00:10 AM Interpretation:glu 101, Cr 1.66; GFR 43 Performing Lab: Notes/Report: Testing Performed at: [facility], [address], [address], [place], [place] [address]  Glucose 101 70-99 mg/dL    BUN 18 3-73 mg/dL    Creatinine 8.33 9.39-8.69 mg/dl    [id number] 43 >39 calc In accordance with recommendations from NKF-ASN Task Force, [facility] has updated its eGFR calc to the 2021 CKD-EDI equation that estimates kidney function without a race variable;Stage 1 > 90 ML/Min plus Albuminuria;Stage 2 60-89 ML/MIN;Stage 3 30-59 ML/MIN;Stage 4 15-29 ML/MIN;Stage 5 <15 ML/MIN  Sodium 139 136-145 mmol/L    Potassium 4.6 3.5-5.5 mmol/L    Chloride 106 98-107 mmol/L    CO2 26 22-32 mmol/L    Anion Gap 11.7 6.0-20.0 mmol/L    Calcium 9.0 8.6-10.3 mg/dL    CA-corrected 1.46 1.39-89.69 mg/dL    Protein, Total 7.1 3.9-1.6 g/dL    Albumin 4.6 6.5-5.1 g/dL    TBIL 0.7 9.6-8.9 mg/dL    ALP 80 61-873 U/L    AST 17 0-39 U/L    ALT 16 0-52 U/L    PSA Reviewed date:08/02/2023 11:00:10 AM Interpretation:0.06 Performing Lab: Notes/Report: Testing Performed at: [facility], [address], [place], [place] [address]  PSA 0.06  0.01-4.00 ng/mL PSA Performed by [employer]  Values obtained with different assay methods or kits cannot be used interchangeably. Results cannot be interpreted as absolute evidence of the presence or absence of malignant disease.   Vitamin B12 Reviewed date:08/02/2023 01:25:29 PM Interpretation:261 Performing Lab: Notes/Report: Testing Performed at: [facility], [address], [address], [place], [place] [address]  B12 261 180-914 pg/mL    Lipid Panel Reviewed date:08/02/2023 01:25:29 PM Interpretation:LDL 157 Performing Lab: Notes/Report: Testing Performed at: [facility], [address], [address], [place], [place] [address]  Cholesterol 222 <200 mg/dL    CHOL/HDL 6.3 7.9-5.9 Ratio    HDLD 35 30-70 mg/dL Values below 40 mg/dL indicate increased risk factor  Triglyceride 164 0-199 mg/dL    NHDL 812 9-870 mg/dL Range dependent upon risk factors.  LDL Chol Calc (NIH) 157 0-99 mg/dL     Physical Exam:    Today's Vitals   04/09/24 0810  BP: 134/74  Pulse: 62  SpO2: 97%  Weight: 166 lb (75.3 kg)  Height: 5' 11 (1.803 m)   Body mass index is 23.15 kg/m. Wt Readings from Last 3 Encounters:  04/09/24 166 lb (75.3 kg)  02/12/23 170 lb (77.1 kg)  10/18/22 166 lb 12.8 oz (75.7 kg)    Physical Exam  Constitutional: No distress.  Age appropriate, hemodynamically stable.   Neck: No JVD present.  Cardiovascular: Normal rate, regular rhythm, S1 normal, S2 normal, intact distal pulses and normal pulses. Exam reveals no gallop, no S3 and no S4.  No murmur heard. Pulmonary/Chest: Effort normal and breath sounds normal. No stridor. He has no wheezes. He has no rales.  Abdominal: Soft. Bowel sounds are normal. He exhibits no distension. There is no abdominal tenderness.  Musculoskeletal:        General: No edema.  Cervical back: Neck supple.  Neurological: He is alert and oriented to person, place, and time. He has intact cranial nerves (2-12).  Skin: Skin is warm and  moist.     Impression & Recommendation(s):  Impression:   ICD-10-CM   1. Chronic heart failure with preserved ejection fraction (HFpEF) (HCC)  I50.32 EKG 12-Lead    losartan  (COZAAR ) 25 MG tablet    torsemide  (DEMADEX ) 10 MG tablet    Basic Metabolic Panel (BMET)    2. PVC's (premature ventricular contractions)  I49.3         Recommendation(s):  Chronic heart failure with preserved ejection fraction (HFpEF) (HCC) Stage III, NYHA class I/II. Has done very well with his current medical regimen of losartan  and torsemide . Given his age, underlying dementia, patient and family wanted to continue with conservative management Refill losartan  25 mg p.o. every afternoon. Refill torsemide  10 mg p.o. every morning. Will check a BMP to reevaluate renal function. It appears that his serum creatinine ranges between 1.4-1.6 mg/dL As long as renal function is stable we will continue with current medical therapy otherwise we will have to call the patient's daughter and consider using torsemide  on as-needed basis based on his weights.  Further recommendations to follow  PVC's (premature ventricular contractions) 3-day Zio monitor was only worn for approximately 24 hours due to patient preference. Underlying rhythm is sinus with both PAC and PVC burden. Clinically he is asymptomatic Conservative management for now  Routine EKG as a 1 year follow-up noted subtle TWI which are more noticeable compared to prior tracing.  His LVEF is preserved.  Clinically denies anginal chest pain or heart failure symptoms.  In the past patient and family wanted to proceed with more conservative approach.  In an asymptomatic male we will hold off on additional testing at this time patient and daughter are agreeable.  They will call us  back if he is symptomatic.  Orders Placed:  Orders Placed This Encounter  Procedures   Basic Metabolic Panel (BMET)   EKG 12-Lead    Final Medication List:    Meds ordered this  encounter  Medications   losartan  (COZAAR ) 25 MG tablet    Sig: Take 1 tablet (25 mg total) by mouth daily at 10 pm.    Dispense:  30 tablet    Refill:  0    1ST ATTEMPT, PT IS OVERDUE FOR AN APPT. PLEASE HAVE PT CALL TO SCHEDULE.   torsemide  (DEMADEX ) 10 MG tablet    Sig: Take 1 tablet (10 mg total) by mouth every morning.    Dispense:  30 tablet    Refill:  0    Please call our office to schedule an overdue appointment with Dr. Michele before anymore refills. [phone]. Thank you 1st attempt    Medications Discontinued During This Encounter  Medication Reason   losartan  (COZAAR ) 25 MG tablet Reorder   torsemide  (DEMADEX ) 10 MG tablet Reorder     Current Outpatient Medications:    losartan  (COZAAR ) 25 MG tablet, Take 1 tablet (25 mg total) by mouth daily at 10 pm., Disp: 30 tablet, Rfl: 0   torsemide  (DEMADEX ) 10 MG tablet, Take 1 tablet (10 mg total) by mouth every morning., Disp: 30 tablet, Rfl: 0  Consent:   N/A  Disposition:   1 year follow-up, sooner if needed  His questions and concerns were addressed to his satisfaction. He voices understanding of the recommendations provided during this encounter.    Signed, Madonna Michele HAS, [facility]  A Division of [facility] [address], [place], [place] [address]   04/09/2024 11:41 AM "

## 2024-04-12 ENCOUNTER — Ambulatory Visit: Payer: Self-pay | Admitting: Cardiology

## 2024-04-13 NOTE — Telephone Encounter (Signed)
 LMTCB

## 2024-04-13 NOTE — Telephone Encounter (Signed)
 LMCTB

## 2024-04-13 NOTE — Telephone Encounter (Signed)
-----   Message from [place], [place] sent at 04/12/2024 11:59 PM EST ----- Please call the patient's daughter with the recommendations. Renal function relatively at baseline. Would still encourage increasing water intake by 2 to 3 glasses/day. Change his torsemide  to 10 mg every other day. Please have him monitor his daily weights-if he gains more than 3 pounds over 24 hours or 5 pounds over a week to call us  back.  Dr. Tolia

## 2024-04-16 NOTE — Telephone Encounter (Signed)
 Spoke with patients daughter per DPR. Relayed Dr. Tyree note. All concerns addressed.

## 2024-04-16 NOTE — Telephone Encounter (Signed)
-----   Message from [place], [place] sent at 04/12/2024 11:59 PM EST ----- Please call the patient's daughter with the recommendations. Renal function relatively at baseline. Would still encourage increasing water intake by 2 to 3 glasses/day. Change his torsemide  to 10 mg every other day. Please have him monitor his daily weights-if he gains more than 3 pounds over 24 hours or 5 pounds over a week to call us  back.  Dr. Tolia
